# Patient Record
Sex: Male | Born: 1990 | Race: Black or African American | Hispanic: No | Marital: Single | Smoking: Current every day smoker
Health system: Southern US, Community
[De-identification: ages and names within clinical notes are randomized; demographics above are authoritative.]

## PROBLEM LIST (undated history)

## (undated) DIAGNOSIS — B009 Herpesviral infection, unspecified: Secondary | ICD-10-CM

## (undated) DIAGNOSIS — G43909 Migraine, unspecified, not intractable, without status migrainosus: Secondary | ICD-10-CM

## (undated) DIAGNOSIS — A749 Chlamydial infection, unspecified: Secondary | ICD-10-CM

---
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
## 2010-12-14 ENCOUNTER — Inpatient Hospital Stay (INDEPENDENT_AMBULATORY_CARE_PROVIDER_SITE_OTHER)
Admission: RE | Admit: 2010-12-14 | Discharge: 2010-12-14 | Disposition: A | Discharge status: Home / Self Care | Payer: Self-pay | Source: Ambulatory Visit | Attending: Emergency Medicine | Admitting: Emergency Medicine

## 2010-12-14 DIAGNOSIS — A64 Unspecified sexually transmitted disease: Secondary | ICD-10-CM

## 2010-12-19 LAB — GC/CHLAMYDIA PROBE AMP, GENITAL
Chlamydia, DNA Probe: POSITIVE — AB
GC Probe Amp, Genital: NEGATIVE

## 2011-10-21 ENCOUNTER — Emergency Department (HOSPITAL_COMMUNITY)
Admission: EM | Admit: 2011-10-21 | Discharge: 2011-10-21 | Disposition: A | Discharge status: Home / Self Care | Payer: BC Managed Care – PPO | Attending: Emergency Medicine | Admitting: Emergency Medicine

## 2011-10-21 ENCOUNTER — Encounter: Payer: Self-pay | Admitting: Emergency Medicine

## 2011-10-21 DIAGNOSIS — R599 Enlarged lymph nodes, unspecified: Secondary | ICD-10-CM | POA: Insufficient documentation

## 2011-10-21 DIAGNOSIS — R0602 Shortness of breath: Secondary | ICD-10-CM | POA: Insufficient documentation

## 2011-10-21 DIAGNOSIS — J029 Acute pharyngitis, unspecified: Secondary | ICD-10-CM | POA: Insufficient documentation

## 2011-10-21 LAB — RAPID STREP SCREEN (MED CTR MEBANE ONLY): Streptococcus, Group A Screen (Direct): NEGATIVE

## 2011-10-21 MED ORDER — DEXAMETHASONE SODIUM PHOSPHATE 10 MG/ML IJ SOLN
10.0000 mg | Freq: Once | INTRAMUSCULAR | Status: AC
  Administered 2011-10-21: 10 mg via INTRAVENOUS
  Filled 2011-10-21: qty 1

## 2011-10-21 MED ORDER — CLINDAMYCIN PHOSPHATE 600 MG/50ML IV SOLN
600.0000 mg | Freq: Once | INTRAVENOUS | Status: AC
  Administered 2011-10-21: 600 mg via INTRAVENOUS
  Filled 2011-10-21: qty 50

## 2011-10-21 MED ORDER — CLINDAMYCIN HCL 150 MG PO CAPS: 300.0000 mg | ORAL_CAPSULE | Freq: Four times a day (QID) | ORAL | Status: AC

## 2011-10-21 MED ORDER — ONDANSETRON 4 MG PO TBDP
4.0000 mg | ORAL_TABLET | Freq: Once | ORAL | Status: AC
  Administered 2011-10-21: 4 mg via ORAL
  Filled 2011-10-21: qty 1

## 2011-10-21 MED ORDER — HYDROCODONE-ACETAMINOPHEN 7.5-500 MG/15ML PO SOLN: 15.0000 mL | Freq: Four times a day (QID) | ORAL | Status: AC | PRN

## 2011-10-21 MED ORDER — SODIUM CHLORIDE 0.9 % IV BOLUS (SEPSIS)
1000.0000 mL | Freq: Once | INTRAVENOUS | Status: AC
  Administered 2011-10-21: 1000 mL via INTRAVENOUS

## 2011-10-21 MED ORDER — HYDROMORPHONE HCL PF 1 MG/ML IJ SOLN
0.5000 mg | Freq: Once | INTRAMUSCULAR | Status: AC
  Administered 2011-10-21: 0.5 mg via INTRAVENOUS
  Filled 2011-10-21: qty 1

## 2011-10-21 NOTE — ED Notes (Signed)
Pt c/o sore throat x 3 days. Pt noted to have sever swelling to back of throat, pt states it is hard to swallow and sometimes hard to breathe

## 2011-10-21 NOTE — ED Notes (Signed)
Pt states drank po without diffculty. Pt states throat feels better. Pt cont to await further dispo. Will cont to monitor

## 2011-10-21 NOTE — ED Notes (Signed)
Pt given coke 

## 2011-10-21 NOTE — ED Notes (Signed)
Pt given discharge instructions and verbalizes understanding  

## 2011-10-21 NOTE — ED Provider Notes (Signed)
History     CSN: [id number] Arrival date & time: 10/21/2011  6:03 PM   First MD Initiated Contact with Patient 10/21/11 1848      Chief Complaint  Patient presents with  . Sore Throat    (Consider location/radiation/quality/duration/timing/severity/associated sxs/prior treatment) Patient is a 20 y.o. male presenting with pharyngitis. The history is provided by the patient.  Sore Throat This is a new problem. The current episode started more than 2 days ago. The problem occurs constantly. The problem has been gradually worsening. Associated symptoms include shortness of breath. Pertinent negatives include no chest pain, no abdominal pain and no headaches.   patient has had a sore throat for the last 3 days. Some swelling. Some difficulty swallowing mild difficulty breathing due to the pain. Some chills. No cough. He is not drooling. He is otherwise healthy.  Past Medical History  Diagnosis Date  . Asthma     History reviewed. No pertinent past surgical history.  History reviewed. No pertinent family history.  History  Substance Use Topics  . Smoking status: Current Everyday Smoker    Types: Cigarettes  . Smokeless tobacco: Not on file  . Alcohol Use: Yes      Review of Systems  Constitutional: Positive for fever. Negative for activity change and appetite change.  HENT: Positive for sore throat. Negative for congestion, neck stiffness and tinnitus.   Eyes: Negative for pain.  Respiratory: Positive for shortness of breath. Negative for chest tightness, wheezing and stridor.   Cardiovascular: Negative for chest pain and leg swelling.  Gastrointestinal: Negative for nausea, vomiting, abdominal pain and diarrhea.  Genitourinary: Negative for flank pain.  Musculoskeletal: Negative for back pain.  Skin: Negative for rash.  Neurological: Negative for weakness, numbness and headaches.  Psychiatric/Behavioral: Negative for behavioral problems.    Allergies   Penicillins  Home Medications   Current Outpatient Rx  Name Route Sig Dispense Refill  . CLINDAMYCIN HCL 150 MG PO CAPS Oral Take 2 capsules (300 mg total) by mouth 4 (four) times daily. 56 capsule 0  . HYDROCODONE-ACETAMINOPHEN 7.5-500 MG/15ML PO SOLN Oral Take 15 mLs by mouth every 6 (six) hours as needed for pain. 120 mL 0    BP 107/57  Pulse 63  Temp(Src) 98 F (36.7 C) (Oral)  Resp 18  SpO2 100%  Physical Exam  Nursing note and vitals reviewed. Constitutional: He is oriented to person, place, and time. He appears well-developed and well-nourished.  HENT:  Head: Normocephalic and atraumatic.       Posterior pharyngeal erythema and edema. Worse on left side. Uvula is midline. Mild bilateral tonsillar swelling. No stridor.  Eyes: EOM are normal. Pupils are equal, round, and reactive to light.  Neck: Normal range of motion. Neck supple. No tracheal deviation present.       Cervical adenopathy worse on the left side.  Cardiovascular: Normal rate, regular rhythm and normal heart sounds.   No murmur heard. Pulmonary/Chest: Effort normal and breath sounds normal.  Abdominal: Soft. Bowel sounds are normal. He exhibits no distension and no mass. There is no tenderness. There is no rebound and no guarding.  Musculoskeletal: Normal range of motion. He exhibits no edema.  Lymphadenopathy:    He has cervical adenopathy.  Neurological: He is alert and oriented to person, place, and time. No cranial nerve deficit.  Skin: Skin is warm and dry.  Psychiatric: He has a normal mood and affect.    ED Course  Procedures (including critical care time)  Labs Reviewed  RAPID STREP SCREEN   No results found.   1. Pharyngitis       MDM  Patient has had sore throat for last 2 days. Pain is worse with swallowing. There is some lateralizing posteriorly. Does not appear to be a peritonsillar abscess at this point. Patient feels better after treatment. He was given steroids and  antibiotics here. I will see him in followup.        Juliet Rude. Rubin Payor, MD 10/21/11 [phone]

## 2012-02-05 ENCOUNTER — Encounter (HOSPITAL_COMMUNITY): Payer: Self-pay | Admitting: *Deleted

## 2012-02-05 ENCOUNTER — Emergency Department (INDEPENDENT_AMBULATORY_CARE_PROVIDER_SITE_OTHER)
Admission: EM | Admit: 2012-02-05 | Discharge: 2012-02-05 | Disposition: A | Discharge status: Home / Self Care | Payer: BC Managed Care – PPO | Source: Home / Self Care | Attending: Emergency Medicine | Admitting: Emergency Medicine

## 2012-02-05 DIAGNOSIS — N342 Other urethritis: Secondary | ICD-10-CM

## 2012-02-05 HISTORY — DX: Chlamydial infection, unspecified: A74.9

## 2012-02-05 MED ORDER — LIDOCAINE HCL (PF) 1 % IJ SOLN
INTRAMUSCULAR | Status: AC
  Filled 2012-02-05: qty 5

## 2012-02-05 MED ORDER — AZITHROMYCIN 250 MG PO TABS
ORAL_TABLET | ORAL | Status: AC
  Filled 2012-02-05: qty 4

## 2012-02-05 MED ORDER — CEFTRIAXONE SODIUM 250 MG IJ SOLR
250.0000 mg | Freq: Once | INTRAMUSCULAR | Status: AC
  Administered 2012-02-05: 250 mg via INTRAMUSCULAR

## 2012-02-05 MED ORDER — AZITHROMYCIN 250 MG PO TABS
1000.0000 mg | ORAL_TABLET | Freq: Once | ORAL | Status: AC
  Administered 2012-02-05: 1000 mg via ORAL

## 2012-02-05 MED ORDER — CEFTRIAXONE SODIUM 250 MG IJ SOLR
INTRAMUSCULAR | Status: AC
  Filled 2012-02-05: qty 250

## 2012-02-05 NOTE — ED Notes (Signed)
WHEN OBTAINED LAB CLICKED OFF, SHOULD HAVE BEEN PENILE INSTEAD OF CERVIX. CALLED MAIN LAB FOR VERBAL CORRECTION OF LOCATION

## 2012-02-05 NOTE — Discharge Instructions (Signed)
Take the medication as written. Give us a working phone number so that we can contact you if needed. Refrain from sexual contact until you know your results and your partner(s) are treated. Return if you get worse, have a fever >100.4, or for any concerns.  ° °Go to www.goodrx.com to look up your medications. This will give you a list of where you can find your prescriptions at the most affordable prices.  °

## 2012-02-05 NOTE — ED Provider Notes (Signed)
History     CSN: [id number]  Arrival date & time 02/05/12  4098   First MD Initiated Contact with Patient 02/05/12 [phone]      Chief Complaint  Patient presents with  . SEXUALLY TRANSMITTED DISEASE    (Consider location/radiation/quality/duration/timing/severity/associated sxs/prior treatment) HPI Comments: Pt with one week of clear nonodorous penile discharge.  No urgency, frequency, dysuria, oderous urine, hematuria,  genital blisters, penile itching. No fevers, N/V, abd pain.  Pt sexually active with 4-5 male partners, who are asxatic. States that he forgot to use condom with one of them. STD's a concern today. Has a history of Chlamydia. No history of gonorrhea  trichmonoas. No h/o syphilis, herpes, HIV.    Patient is a 21 y.o. male presenting with penile discharge. The history is provided by the patient. No language interpreter was used.  Penile Discharge This is a new problem. The current episode started more than 2 days ago. The problem occurs constantly. The problem has not changed since onset.Pertinent negatives include no abdominal pain. The symptoms are aggravated by nothing. The symptoms are relieved by nothing. He has tried nothing for the symptoms. The treatment provided no relief.    Past Medical History  Diagnosis Date  . Asthma   . Chlamydia     History reviewed. No pertinent past surgical history.  History reviewed. No pertinent family history.  History  Substance Use Topics  . Smoking status: Current Everyday Smoker -- 0.5 packs/day    Types: Cigarettes  . Smokeless tobacco: Not on file  . Alcohol Use: Yes      Review of Systems  Constitutional: Negative for fever.  Gastrointestinal: Negative for nausea, vomiting and abdominal pain.  Genitourinary: Positive for discharge. Negative for dysuria, urgency, frequency, hematuria, penile swelling, scrotal swelling, genital sores, penile pain and testicular pain.  Musculoskeletal: Negative for arthralgias.   Skin: Negative for rash.  Neurological: Negative for weakness.    Allergies  Penicillins  Home Medications  No current outpatient prescriptions on file.  BP 113/63  Pulse 77  Temp(Src) 98.7 F (37.1 C) (Oral)  Resp 17  SpO2 100%  Physical Exam  Nursing note and vitals reviewed. Constitutional: He is oriented to person, place, and time. He appears well-developed and well-nourished.  HENT:  Head: Normocephalic and atraumatic.  Eyes: Conjunctivae and EOM are normal.  Neck: Normal range of motion.  Cardiovascular: Normal rate.   Pulmonary/Chest: Effort normal. No respiratory distress.  Abdominal: He exhibits no distension.  Genitourinary: Testes normal. Uncircumcised. Discharge found.       No genital rash. Clear penile discharge. Patient declined chaperone  Musculoskeletal: Normal range of motion.  Lymphadenopathy:       Right: No inguinal adenopathy present.       Left: No inguinal adenopathy present.  Neurological: He is alert and oriented to person, place, and time.  Skin: Skin is warm and dry.  Psychiatric: He has a normal mood and affect. His behavior is normal.    ED Course  Procedures (including critical care time)   Labs Reviewed  GC/CHLAMYDIA PROBE AMP, GENITAL   No results found.   1. Urethritis       MDM  Previous records reviewed. Patient with positive chlamydia February 2012. Gonorrhea negative  H&P most c/w chlamydia.. Sent off GC/chlamydia,Will  treat empirically now. Giving ceftriaxone 250 mg IM/azithro 1 gm po.  Advised pt to refrain from sexual contact until  he knows lab results, symptoms resolve, and partner(s) are treated. Pt provided working phone  number. Pt agrees.     Luiz Blare, MD 02/05/12 [phone]

## 2012-02-05 NOTE — ED Notes (Signed)
Pt states he's been having a penile discharge with "stinging" sensation when he urinates for approx a wee

## 2012-02-06 LAB — GC/CHLAMYDIA PROBE AMP, GENITAL: GC Probe Amp, Genital: NEGATIVE

## 2012-02-07 ENCOUNTER — Telehealth (HOSPITAL_COMMUNITY): Payer: Self-pay | Admitting: *Deleted

## 2012-02-07 NOTE — ED Notes (Addendum)
GC neg., Chlamydia pos. Pt. adequately treated with Zithromax and Rocephin. I called pt. Pt. verified x 2 and given results.  Pt. told he was adequately treated with the Zithromax. Pt. instructed to notify his partner, no sex for 1 week and to practice safe sex. Pt. told they can get HIV testing at the [facility]. STD clinic.  Pt. voiced understanding.  DHHS form completed and faxed to the [facility]. Vassie Moselle 02/07/2012

## 2012-07-03 ENCOUNTER — Emergency Department (INDEPENDENT_AMBULATORY_CARE_PROVIDER_SITE_OTHER)
Admission: EM | Admit: 2012-07-03 | Discharge: 2012-07-03 | Disposition: A | Discharge status: Home / Self Care | Payer: BC Managed Care – PPO | Source: Home / Self Care | Attending: Family Medicine | Admitting: Family Medicine

## 2012-07-03 ENCOUNTER — Encounter (HOSPITAL_COMMUNITY): Payer: Self-pay

## 2012-07-03 DIAGNOSIS — N342 Other urethritis: Secondary | ICD-10-CM

## 2012-07-03 DIAGNOSIS — B360 Pityriasis versicolor: Secondary | ICD-10-CM

## 2012-07-03 MED ORDER — CEFTRIAXONE SODIUM 1 G IJ SOLR
250.0000 mg | Freq: Once | INTRAMUSCULAR | Status: AC
  Administered 2012-07-03: 250 mg via INTRAMUSCULAR

## 2012-07-03 MED ORDER — LIDOCAINE HCL (PF) 1 % IJ SOLN
INTRAMUSCULAR | Status: AC
  Filled 2012-07-03: qty 5

## 2012-07-03 MED ORDER — AZITHROMYCIN 250 MG PO TABS
ORAL_TABLET | ORAL | Status: AC
  Filled 2012-07-03: qty 4

## 2012-07-03 MED ORDER — AZITHROMYCIN 250 MG PO TABS
1000.0000 mg | ORAL_TABLET | Freq: Once | ORAL | Status: AC
  Administered 2012-07-03: 1000 mg via ORAL

## 2012-07-03 MED ORDER — TERBINAFINE HCL 250 MG PO TABS: 250.0000 mg | ORAL_TABLET | Freq: Every day | ORAL | Status: DC

## 2012-07-03 MED ORDER — CEFTRIAXONE SODIUM 250 MG IJ SOLR
INTRAMUSCULAR | Status: AC
  Filled 2012-07-03: qty 250

## 2012-07-03 NOTE — ED Provider Notes (Signed)
History     CSN: [id number]  Arrival date & time 07/03/12  1059   First MD Initiated Contact with Patient 07/03/12 1112      Chief Complaint  Patient presents with  . SEXUALLY TRANSMITTED DISEASE    (Consider location/radiation/quality/duration/timing/severity/associated sxs/prior treatment) Patient is a 21 y.o. male presenting with STD exposure and rash. The history is provided by the patient.  Exposure to STD This is a new problem. The current episode started more than 2 days ago. The problem has been gradually worsening.  Rash  This is a chronic problem. The current episode started more than 1 week ago. The problem has not changed since onset.The problem is associated with an unknown factor. There has been no fever. The rash is present on the torso.    Past Medical History  Diagnosis Date  . Asthma   . Chlamydia     History reviewed. No pertinent past surgical history.  History reviewed. No pertinent family history.  History  Substance Use Topics  . Smoking status: Current Everyday Smoker -- 0.5 packs/day    Types: Cigarettes  . Smokeless tobacco: Not on file  . Alcohol Use: Yes      Review of Systems  Constitutional: Negative.   Genitourinary: Positive for discharge. Negative for penile swelling and penile pain.  Skin: Positive for rash.    Allergies  Penicillins  Home Medications   Current Outpatient Rx  Name Route Sig Dispense Refill  . TERBINAFINE HCL 250 MG PO TABS Oral Take 1 tablet (250 mg total) by mouth daily. As instructed. 30 tablet 0    BP 134/83  Pulse 74  Temp 98.2 F (36.8 C) (Oral)  Resp 16  SpO2 100%  Physical Exam  Nursing note and vitals reviewed. Constitutional: He appears well-developed and well-nourished.  Genitourinary: Testes normal. Uncircumcised. Discharge found.  Lymphadenopathy:       Right: No inguinal adenopathy present.       Left: No inguinal adenopathy present.  Skin: Skin is warm and dry. Rash noted.   Pruritic scaly hypopigmented irreg rash across upper chest and back.    ED Course  Procedures (including critical care time)   Labs Reviewed  GC/CHLAMYDIA PROBE AMP, GENITAL   No results found.   1. Urethritis   2. Tinea versicolor       MDM          Linna Hoff, MD 07/03/12 [phone]

## 2012-07-03 NOTE — ED Notes (Signed)
C/o 3 month duration of rash on shoulder , hist of ringworm; Also concerned for poss STD , c/o dysuria and penile d/c

## 2012-07-07 LAB — GC/CHLAMYDIA PROBE AMP, GENITAL
Chlamydia, DNA Probe: NEGATIVE
GC Probe Amp, Genital: POSITIVE — AB

## 2012-07-14 ENCOUNTER — Telehealth (HOSPITAL_COMMUNITY): Payer: Self-pay | Admitting: *Deleted

## 2012-07-14 NOTE — ED Notes (Signed)
GC pos., Chlamydia neg.,  Pt. adequately treated with Rocephin and Zithromax.  I called pt.  Pt. verified x 2 and given results. Pt. told he was adequately treated.  Pt. instructed to notify his partner, no sex for 1 week and to practice safe sex. Pt. told they can get HIV testing at the [facility]. STD clinic, by appointment.  Pt. voiced understanding.  DHHS form completed and faxed to the [facility]. Vassie Moselle 07/14/2012

## 2013-01-23 ENCOUNTER — Emergency Department (HOSPITAL_COMMUNITY)
Admission: EM | Admit: 2013-01-23 | Discharge: 2013-01-23 | Disposition: A | Discharge status: Home / Self Care | Payer: Self-pay | Attending: Emergency Medicine | Admitting: Emergency Medicine

## 2013-01-23 ENCOUNTER — Encounter (HOSPITAL_COMMUNITY): Payer: Self-pay | Admitting: Emergency Medicine

## 2013-01-23 DIAGNOSIS — J029 Acute pharyngitis, unspecified: Secondary | ICD-10-CM | POA: Insufficient documentation

## 2013-01-23 DIAGNOSIS — Z8619 Personal history of other infectious and parasitic diseases: Secondary | ICD-10-CM | POA: Insufficient documentation

## 2013-01-23 DIAGNOSIS — F1210 Cannabis abuse, uncomplicated: Secondary | ICD-10-CM | POA: Insufficient documentation

## 2013-01-23 DIAGNOSIS — J45909 Unspecified asthma, uncomplicated: Secondary | ICD-10-CM | POA: Insufficient documentation

## 2013-01-23 DIAGNOSIS — R509 Fever, unspecified: Secondary | ICD-10-CM | POA: Insufficient documentation

## 2013-01-23 DIAGNOSIS — F17200 Nicotine dependence, unspecified, uncomplicated: Secondary | ICD-10-CM | POA: Insufficient documentation

## 2013-01-23 MED ORDER — LIDOCAINE HCL 2 % IJ SOLN
INTRAMUSCULAR | Status: AC
  Administered 2013-01-23: 0.9 mL
  Filled 2013-01-23: qty 20

## 2013-01-23 MED ORDER — CEFTRIAXONE SODIUM 250 MG IJ SOLR
250.0000 mg | Freq: Once | INTRAMUSCULAR | Status: AC
  Administered 2013-01-23: 250 mg via INTRAMUSCULAR
  Filled 2013-01-23: qty 250

## 2013-01-23 MED ORDER — AZITHROMYCIN 250 MG PO TABS
1000.0000 mg | ORAL_TABLET | Freq: Once | ORAL | Status: AC
  Administered 2013-01-23: 1000 mg via ORAL
  Filled 2013-01-23: qty 4

## 2013-01-23 NOTE — ED Provider Notes (Signed)
  Medical screening examination/treatment/procedure(s) were performed by non-physician practitioner and as supervising physician I was immediately available for consultation/collaboration.    Isrrael Fluckiger, MD 01/23/13 1548 

## 2013-01-23 NOTE — ED Notes (Signed)
Per pt, was treated for sore throat on Wednesday-given prednisone and Zpack-not helping-blisters on throat

## 2013-01-23 NOTE — ED Provider Notes (Signed)
History     CSN: [id number]  Arrival date & time 01/23/13  4098   First MD Initiated Contact with Patient 01/23/13 1003      Chief Complaint  Patient presents with  . Sore Throat    (Consider location/radiation/quality/duration/timing/severity/associated sxs/prior treatment) HPI Comments: This is a 22 year old male, who presents emergency department with chief complaint of sore throat. He was seen recently for the same in [facility], and was given prednisone and a Z-Pak. Patient states that this helped. He reports running a fever at night. States that his pain is moderate to severe. He denies any difficulty breathing. States the pain is worsened with swallowing. He endorses having lots of oral sex.  The history is provided by the patient. No language interpreter was used.    Past Medical History  Diagnosis Date  . Asthma   . Chlamydia     History reviewed. No pertinent past surgical history.  No family history on file.  History  Substance Use Topics  . Smoking status: Current Every Day Smoker -- 0.50 packs/day    Types: Cigarettes  . Smokeless tobacco: Not on file  . Alcohol Use: Yes      Review of Systems  All other systems reviewed and are negative.    Allergies  Penicillins  Home Medications   Current Outpatient Rx  Name  Route  Sig  Dispense  Refill  . terbinafine (LAMISIL) 250 MG tablet   Oral   Take 1 tablet (250 mg total) by mouth daily. As instructed.   30 tablet   0     BP 103/57  Pulse 85  Temp(Src) 98.9 F (37.2 C) (Oral)  Resp 18  SpO2 100%  Physical Exam  Nursing note and vitals reviewed. Constitutional: He is oriented to person, place, and time. He appears well-developed and well-nourished.  HENT:  Head: Normocephalic and atraumatic.  Oropharynx is red and inflamed, with exudates on tonsils, no signs of tonsillar or peritonsillar abscesses, uvula is midline, airway is intact  Eyes: Conjunctivae and EOM are normal.  Neck: Normal  range of motion.  Cardiovascular: Normal rate.   Pulmonary/Chest: Effort normal.  Abdominal: He exhibits no distension.  Musculoskeletal: Normal range of motion.  Neurological: He is alert and oriented to person, place, and time.  Skin: Skin is dry.  Psychiatric: He has a normal mood and affect. His behavior is normal. Judgment and thought content normal.    ED Course  Procedures (including critical care time)  Labs Reviewed  GC/CHLAMYDIA PROBE AMP   Results for orders placed during the hospital encounter of 01/23/13  RAPID STREP SCREEN      Result Value Range   Streptococcus, Group A Screen (Direct) NEGATIVE  NEGATIVE         1. Pharyngitis       MDM  22 year old male with sore throat and tonsillar exudates. Patient recently tested for strep, which was negative. Will retest today. Also suspicious of, gonococcal infection, as the patient endorses having a lot of oral sex. Will wait rapid strep results, which if negative, will treat for GC/Chlamydia.  11:10 AM Rapid strep test is negative. Patient tells me he had a Monospot test, which was also negative on Wednesday. Will treat for gonococcal pharyngitis.        Roxy Horseman, PA-C 01/23/13 1112

## 2014-09-19 ENCOUNTER — Ambulatory Visit: Payer: Self-pay

## 2014-09-19 ENCOUNTER — Other Ambulatory Visit: Payer: Self-pay | Admitting: Occupational Medicine

## 2014-09-19 DIAGNOSIS — Z0000 Encounter for general adult medical examination without abnormal findings: Secondary | ICD-10-CM

## 2015-12-13 ENCOUNTER — Other Ambulatory Visit (HOSPITAL_COMMUNITY)
Admission: RE | Admit: 2015-12-13 | Discharge: 2015-12-13 | Disposition: A | Discharge status: Home / Self Care | Payer: PRIVATE HEALTH INSURANCE | Source: Ambulatory Visit | Attending: Family Medicine | Admitting: Family Medicine

## 2015-12-13 ENCOUNTER — Emergency Department (INDEPENDENT_AMBULATORY_CARE_PROVIDER_SITE_OTHER)
Admission: EM | Admit: 2015-12-13 | Discharge: 2015-12-13 | Disposition: A | Discharge status: Home / Self Care | Payer: PRIVATE HEALTH INSURANCE | Source: Home / Self Care | Attending: Family Medicine | Admitting: Family Medicine

## 2015-12-13 ENCOUNTER — Encounter (HOSPITAL_COMMUNITY): Payer: Self-pay | Admitting: *Deleted

## 2015-12-13 DIAGNOSIS — Z113 Encounter for screening for infections with a predominantly sexual mode of transmission: Secondary | ICD-10-CM | POA: Diagnosis present

## 2015-12-13 DIAGNOSIS — Z202 Contact with and (suspected) exposure to infections with a predominantly sexual mode of transmission: Secondary | ICD-10-CM

## 2015-12-13 MED ORDER — CEFTRIAXONE SODIUM 250 MG IJ SOLR
250.0000 mg | Freq: Once | INTRAMUSCULAR | Status: AC
  Administered 2015-12-13: 250 mg via INTRAMUSCULAR

## 2015-12-13 MED ORDER — AZITHROMYCIN 250 MG PO TABS
ORAL_TABLET | ORAL | Status: AC
  Filled 2015-12-13: qty 4

## 2015-12-13 MED ORDER — CEFTRIAXONE SODIUM 250 MG IJ SOLR
INTRAMUSCULAR | Status: AC
  Filled 2015-12-13: qty 250

## 2015-12-13 MED ORDER — AZITHROMYCIN 250 MG PO TABS
1000.0000 mg | ORAL_TABLET | Freq: Once | ORAL | Status: AC
  Administered 2015-12-13: 1000 mg via ORAL

## 2015-12-13 NOTE — ED Provider Notes (Signed)
CSN: [id number]     Arrival date & time 12/13/15  1629 History   First MD Initiated Contact with Patient 12/13/15 1817     Chief Complaint  Patient presents with  . Penile Discharge   (Consider location/radiation/quality/duration/timing/severity/associated sxs/prior Treatment) Patient is a 25 y.o. male presenting with penile discharge. The history is provided by the patient.  Penile Discharge This is a new problem. The current episode started more than 2 days ago (condom broke , now with d/c.). The problem has been gradually worsening. Pertinent negatives include no abdominal pain. Associated symptoms comments: Dysuria, purulent d/c.Marland Kitchen    Past Medical History  Diagnosis Date  . Asthma   . Chlamydia    History reviewed. No pertinent past surgical history. History reviewed. No pertinent family history. Social History  Substance Use Topics  . Smoking status: Current Every Day Smoker -- 0.50 packs/day    Types: Cigarettes  . Smokeless tobacco: None  . Alcohol Use: Yes    Review of Systems  Constitutional: Negative.   Cardiovascular: Negative.   Gastrointestinal: Negative for abdominal pain.  Genitourinary: Positive for discharge and penile swelling. Negative for scrotal swelling, penile pain and testicular pain.    Allergies  Penicillins  Home Medications   Prior to Admission medications   Medication Sig Start Date End Date Taking? Authorizing Provider  azithromycin (ZITHROMAX Z-PAK) 250 MG tablet Take 250-500 mg by mouth daily. 2 tablets on day 1 then 1 tablet daily until finished    Historical Provider, MD   Meds Ordered and Administered this Visit   Medications  cefTRIAXone (ROCEPHIN) injection 250 mg (250 mg Intramuscular Given 12/13/15 1839)  azithromycin (ZITHROMAX) tablet 1,000 mg (1,000 mg Oral Given 12/13/15 1839)    BP 121/75 mmHg  Pulse 79  Temp(Src) 98 F (36.7 C) (Oral)  Resp 18  SpO2 100% No data found.   Physical Exam  Constitutional: He is oriented  to person, place, and time. He appears well-developed and well-nourished.  Abdominal: Soft. Bowel sounds are normal.  Genitourinary: Testes normal. Right testis shows no swelling and no tenderness. Left testis shows no swelling and no tenderness. Uncircumcised. No penile tenderness. Discharge found.  Lymphadenopathy:       Right: No inguinal adenopathy present.       Left: No inguinal adenopathy present.  Neurological: He is alert and oriented to person, place, and time.  Skin: Skin is warm and dry.  Nursing note and vitals reviewed.   ED Course  Procedures (including critical care time)  Labs Review Labs Reviewed  CYTOLOGY, (ORAL, ANAL, URETHRAL) ANCILLARY ONLY    Imaging Review No results found.   Visual Acuity Review  Right Eye Distance:   Left Eye Distance:   Bilateral Distance:    Right Eye Near:   Left Eye Near:    Bilateral Near:         MDM   1. Potential exposure to STD        Linna Hoff, MD 12/13/15 2105

## 2015-12-13 NOTE — ED Notes (Signed)
Penile  Discharge   X  1  Week  Pt  Reports   Condom  Busted  During  Recent   Sex

## 2015-12-13 NOTE — Discharge Instructions (Signed)
We will call with positive test results and treat as indicated  °

## 2015-12-14 LAB — CYTOLOGY, (ORAL, ANAL, URETHRAL) ANCILLARY ONLY
Chlamydia: NEGATIVE
NEISSERIA GONORRHEA: NEGATIVE

## 2016-05-21 ENCOUNTER — Encounter (HOSPITAL_COMMUNITY): Payer: Self-pay | Admitting: Emergency Medicine

## 2016-05-21 ENCOUNTER — Ambulatory Visit (HOSPITAL_COMMUNITY)
Admission: EM | Admit: 2016-05-21 | Discharge: 2016-05-21 | Disposition: A | Discharge status: Home / Self Care | Payer: PRIVATE HEALTH INSURANCE | Attending: Family Medicine | Admitting: Family Medicine

## 2016-05-21 DIAGNOSIS — M545 Low back pain, unspecified: Secondary | ICD-10-CM

## 2016-05-21 DIAGNOSIS — F17210 Nicotine dependence, cigarettes, uncomplicated: Secondary | ICD-10-CM | POA: Insufficient documentation

## 2016-05-21 DIAGNOSIS — N419 Inflammatory disease of prostate, unspecified: Secondary | ICD-10-CM | POA: Diagnosis not present

## 2016-05-21 DIAGNOSIS — J45909 Unspecified asthma, uncomplicated: Secondary | ICD-10-CM | POA: Insufficient documentation

## 2016-05-21 DIAGNOSIS — M549 Dorsalgia, unspecified: Secondary | ICD-10-CM | POA: Insufficient documentation

## 2016-05-21 DIAGNOSIS — R300 Dysuria: Secondary | ICD-10-CM | POA: Insufficient documentation

## 2016-05-21 DIAGNOSIS — Z202 Contact with and (suspected) exposure to infections with a predominantly sexual mode of transmission: Secondary | ICD-10-CM | POA: Diagnosis not present

## 2016-05-21 DIAGNOSIS — Z880 Allergy status to penicillin: Secondary | ICD-10-CM | POA: Insufficient documentation

## 2016-05-21 LAB — POCT URINALYSIS DIP (DEVICE)
BILIRUBIN URINE: NEGATIVE
GLUCOSE, UA: NEGATIVE mg/dL
Hgb urine dipstick: NEGATIVE
KETONES UR: NEGATIVE mg/dL
LEUKOCYTES UA: NEGATIVE
NITRITE: NEGATIVE
PH: 7.5 (ref 5.0–8.0)
Protein, ur: NEGATIVE mg/dL
Specific Gravity, Urine: 1.01 (ref 1.005–1.030)
Urobilinogen, UA: 0.2 mg/dL (ref 0.0–1.0)

## 2016-05-21 MED ORDER — AZITHROMYCIN 250 MG PO TABS
1000.0000 mg | ORAL_TABLET | Freq: Once | ORAL | Status: AC
  Administered 2016-05-21: 1000 mg via ORAL

## 2016-05-21 MED ORDER — CEFTRIAXONE SODIUM 250 MG IJ SOLR
250.0000 mg | Freq: Once | INTRAMUSCULAR | Status: AC
  Administered 2016-05-21: 250 mg via INTRAMUSCULAR

## 2016-05-21 MED ORDER — CIPROFLOXACIN HCL 500 MG PO TABS: 500.0000 mg | ORAL_TABLET | Freq: Two times a day (BID) | ORAL | Status: DC

## 2016-05-21 MED ORDER — LIDOCAINE HCL 2 % IJ SOLN
INTRAMUSCULAR | Status: AC
  Filled 2016-05-21: qty 20

## 2016-05-21 MED ORDER — CEFTRIAXONE SODIUM 250 MG IJ SOLR
INTRAMUSCULAR | Status: AC
  Filled 2016-05-21: qty 250

## 2016-05-21 MED ORDER — NAPROXEN 375 MG PO TABS: 375.0000 mg | ORAL_TABLET | Freq: Two times a day (BID) | ORAL | Status: DC

## 2016-05-21 MED ORDER — AZITHROMYCIN 250 MG PO TABS
ORAL_TABLET | ORAL | Status: AC
  Filled 2016-05-21: qty 4

## 2016-05-21 NOTE — ED Notes (Signed)
[facility] a [facility] of bilateral lower back pain and urinary frequency that started 2 days ago. The patient denied any known strains or injuries to his back.

## 2016-05-21 NOTE — ED Provider Notes (Signed)
CSN: [id number]     Arrival date & time 05/21/16  1014 History   First MD Initiated Contact with Patient 05/21/16 1204     Chief Complaint  Patient presents with  . Back Pain  . Urinary Frequency   (Consider location/radiation/quality/duration/timing/severity/associated sxs/prior Treatment) HPI Comments: 25 year old male presents with c/o difficulty urinating.   He states he has problems getting urine out.  He denies any painful urination but states his urine stream is abnormal and he dribbles.  He denies any unprotected sex.  Patient is a 25 y.o. male presenting with back pain and frequency.  Back Pain Location:  Generalized Quality:  Aching Radiates to:  Does not radiate Pain severity:  Mild Pain is:  Worse during the day Onset quality:  Sudden Duration:  1 day Timing:  Constant Progression:  Worsening Chronicity:  New Relieved by:  Nothing Worsened by:  Nothing tried Ineffective treatments:  None tried Associated symptoms: dysuria   Urinary Frequency    Past Medical History  Diagnosis Date  . Asthma   . Chlamydia    History reviewed. No pertinent past surgical history. History reviewed. No pertinent family history. Social History  Substance Use Topics  . Smoking status: Current Every Day Smoker -- 0.50 packs/day    Types: Cigarettes  . Smokeless tobacco: None  . Alcohol Use: Yes    Review of Systems  Constitutional: Negative.   HENT: Negative.   Eyes: Negative.   Respiratory: Negative.   Cardiovascular: Negative.   Gastrointestinal: Negative.   Endocrine: Negative.   Genitourinary: Positive for dysuria and frequency.  Musculoskeletal: Positive for back pain.  Skin: Negative.   Allergic/Immunologic: Negative.   Neurological: Negative.   Hematological: Negative.   Psychiatric/Behavioral: Negative.     Allergies  Penicillins  Home Medications   Prior to Admission medications   Medication Sig Start Date End Date Taking? Authorizing Provider   azithromycin (ZITHROMAX Z-PAK) 250 MG tablet Take 250-500 mg by mouth daily. 2 tablets on day 1 then 1 tablet daily until finished    Historical Provider, MD   Meds Ordered and Administered this Visit  Medications - No data to display  BP 115/78 mmHg  Pulse 65  Temp(Src) 97.7 F (36.5 C) (Oral)  Resp 18  SpO2 98% No data found.   Physical Exam  Constitutional: He appears well-developed and well-nourished.  HENT:  Head: Normocephalic and atraumatic.  Right Ear: External ear normal.  Left Ear: External ear normal.  Mouth/Throat: Oropharynx is clear and moist.  Eyes: Conjunctivae are normal. Pupils are equal, round, and reactive to light.  Neck: Normal range of motion. Neck supple.  Cardiovascular: Normal rate, regular rhythm and normal heart sounds.   Pulmonary/Chest: Effort normal and breath sounds normal.  Abdominal: Soft. Bowel sounds are normal.  Genitourinary: Rectum normal, prostate normal and penis normal.  Musculoskeletal: He exhibits tenderness.  TTP bilateral lumbar paraspinous muscles.    ED Course  Procedures (including critical care time)  Labs Review Labs Reviewed  POCT URINALYSIS DIP (DEVICE)    Imaging Review No results found.   Visual Acuity Review  Right Eye Distance:   Left Eye Distance:   Bilateral Distance:    Right Eye Near:   Left Eye Near:    Bilateral Near:         MDM  Dysuria - cipro 500mg  one po bid x 10 days #20.  UA is wnl.  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  Follow up with PCP if no improvement.  Back pain - naprosyn  one po bid x 10 days #20  Possible STD or STD exposure - Patient mentions that he did get a blow job and is worried he may have and std.  He denies any dysuria or urethral dc and exam is normal.  Will emirically tx with  Rocephin  IM and zithromax  x 4 today here in clinic.  Advised patient that he will be called with  results of UA gc/chlamydia and trichomonas.    Deatra Canter, FNP 05/21/16 1247

## 2016-05-21 NOTE — Discharge Instructions (Signed)

## 2016-05-22 LAB — URINE CYTOLOGY ANCILLARY ONLY
Chlamydia: NEGATIVE
Neisseria Gonorrhea: NEGATIVE
Trichomonas: NEGATIVE

## 2016-07-31 ENCOUNTER — Ambulatory Visit: Payer: Self-pay

## 2016-07-31 ENCOUNTER — Other Ambulatory Visit: Payer: Self-pay | Admitting: Occupational Medicine

## 2016-07-31 DIAGNOSIS — Z021 Encounter for pre-employment examination: Secondary | ICD-10-CM

## 2016-09-06 ENCOUNTER — Encounter (HOSPITAL_COMMUNITY): Payer: Self-pay | Admitting: *Deleted

## 2016-09-06 ENCOUNTER — Ambulatory Visit (HOSPITAL_COMMUNITY)
Admission: EM | Admit: 2016-09-06 | Discharge: 2016-09-06 | Disposition: A | Discharge status: Home / Self Care | Payer: PRIVATE HEALTH INSURANCE | Attending: Family Medicine | Admitting: Family Medicine

## 2016-09-06 DIAGNOSIS — N342 Other urethritis: Secondary | ICD-10-CM | POA: Insufficient documentation

## 2016-09-06 DIAGNOSIS — F17210 Nicotine dependence, cigarettes, uncomplicated: Secondary | ICD-10-CM | POA: Insufficient documentation

## 2016-09-06 LAB — POCT URINALYSIS DIP (DEVICE)
BILIRUBIN URINE: NEGATIVE
Glucose, UA: NEGATIVE mg/dL
KETONES UR: NEGATIVE mg/dL
Leukocytes, UA: NEGATIVE
Nitrite: NEGATIVE
PH: 7 (ref 5.0–8.0)
Protein, ur: NEGATIVE mg/dL
SPECIFIC GRAVITY, URINE: 1.02 (ref 1.005–1.030)
Urobilinogen, UA: 1 mg/dL (ref 0.0–1.0)

## 2016-09-06 MED ORDER — DOXYCYCLINE HYCLATE 100 MG PO TABS: 100.0000 mg | ORAL_TABLET | Freq: Two times a day (BID) | ORAL | 0 refills | Status: DC

## 2016-09-06 NOTE — ED Triage Notes (Signed)
Pt    Reports  Penile   Discharge  That went away 2  Days  Ago      No   Sores     Pt   describes  A  Little  Sting  When  He  Urinates   He  Noticed   The symptoms    3  Days  Ago

## 2016-09-06 NOTE — ED Notes (Signed)
Call back number verified and updated in EPIC... Adv pt to not have SI until lab results comeback neg.... Also adv pt lab results will be on MyChart; instructions given .... Pt verb understanding.   

## 2016-09-06 NOTE — ED Provider Notes (Signed)
[facility]    CSN: [id number] Arrival date & time: 09/06/16  1427     History   Chief Complaint Chief Complaint  Patient presents with  . Penile Discharge    HPI Tyrone Payne is a 25 y.o. male.   This is a 25 year old gentleman who presents to the urgent care center seeking evaluation of a penile discharge.  Patient is quite upset. He's been here 3 times before and been checked for STDs and nothing has turned positive. He has a new partner now. He uses condoms, but he notes that he is uncircumcised, and the condom often breaks when he uses it.  Patient works in a [employer], [employer] environment.       Past Medical History:  Diagnosis Date  . Asthma   . Chlamydia     There are no active problems to display for this patient.   History reviewed. No pertinent surgical history.     Home Medications    Prior to Admission medications   Medication Sig Start Date End Date Taking? Authorizing Provider  doxycycline (VIBRA-TABS) 100 MG tablet Take 1 tablet (100 mg total) by mouth 2 (two) times daily. 09/06/16   Elvina SidleKurt Llewellyn Schoenberger, MD  naproxen (NAPROSYN) 375 MG tablet Take 1 tablet (375 mg total) by mouth 2 (two) times daily. 05/21/16   Deatra CanterWilliam J Oxford, FNP    Family History History reviewed. No pertinent family history.  Social History Social History  Substance Use Topics  . Smoking status: Current Every Day Smoker    Packs/day: 0.50    Types: Cigarettes  . Smokeless tobacco: Not on file  . Alcohol use Yes     Allergies   Penicillins   Review of Systems Review of Systems  Constitutional: Negative.   HENT: Negative for sore throat.   Genitourinary: Positive for discharge and dysuria.  Musculoskeletal: Negative for arthralgias.     Physical Exam Triage Vital Signs ED Triage Vitals [09/06/16 1456]  Enc Vitals Group     BP 120/70     Pulse Rate 68     Resp 18     Temp 98.2 F (36.8 C)     Temp Source Oral     SpO2 100 %   Weight      Height      Head Circumference      Peak Flow      Pain Score      Pain Loc      Pain Edu?      Excl. in GC?    No data found.   Updated Vital Signs BP 120/70 (BP Location: Right Arm)   Pulse 68   Temp 98.2 F (36.8 C) (Oral)   Resp 18   SpO2 100%  urethrit Physical Exam  Constitutional: He appears well-developed and well-nourished.  HENT:  Head: Normocephalic.  Right Ear: External ear normal.  Left Ear: External ear normal.  Mouth/Throat: Oropharynx is clear and moist.  Eyes: Conjunctivae and EOM are normal. Pupils are equal, round, and reactive to light.  Neck: Normal range of motion. Neck supple.  Pulmonary/Chest: Effort normal.  Genitourinary: Penis normal.  Genitourinary Comments: Uncircumcised   Nursing note and vitals reviewed.    UC Treatments / Results  Labs (all labs ordered are listed, but only abnormal results are displayed) Labs Reviewed  POCT URINALYSIS DIP (DEVICE) - Abnormal; Notable for the following:       Result Value   Hgb urine dipstick TRACE (*)    All  other components within normal limits  URINE CYTOLOGY ANCILLARY ONLY    EKG  EKG Interpretation None       Radiology No results found.  Procedures Procedures (including critical care time)  Medications Ordered in UC Medications - No data to display   Initial Impression / Assessment and Plan / UC Course  I have reviewed the triage vital signs and the nursing notes.  Pertinent labs & imaging results that were available during my care of the patient were reviewed by me and considered in my medical decision making (see chart for details).  Clinical Course    Final Clinical Impressions(s) / UC Diagnoses   Final diagnoses:  Urethritis    New Prescriptions New Prescriptions   DOXYCYCLINE (VIBRA-TABS) 100 MG TABLET    Take 1 tablet (100 mg total) by mouth 2 (two) times daily.     Elvina SidleKurt Meda Dudzinski, MD 09/06/16 1536

## 2016-09-09 LAB — URINE CYTOLOGY ANCILLARY ONLY
Chlamydia: NEGATIVE
Neisseria Gonorrhea: NEGATIVE

## 2016-10-18 ENCOUNTER — Ambulatory Visit (HOSPITAL_COMMUNITY)
Admission: EM | Admit: 2016-10-18 | Discharge: 2016-10-18 | Disposition: A | Discharge status: Home / Self Care | Payer: PRIVATE HEALTH INSURANCE | Attending: Internal Medicine | Admitting: Internal Medicine

## 2016-10-18 ENCOUNTER — Encounter (HOSPITAL_COMMUNITY): Payer: Self-pay | Admitting: Emergency Medicine

## 2016-10-18 DIAGNOSIS — Z79899 Other long term (current) drug therapy: Secondary | ICD-10-CM | POA: Diagnosis not present

## 2016-10-18 DIAGNOSIS — B360 Pityriasis versicolor: Secondary | ICD-10-CM | POA: Diagnosis not present

## 2016-10-18 DIAGNOSIS — F17210 Nicotine dependence, cigarettes, uncomplicated: Secondary | ICD-10-CM | POA: Insufficient documentation

## 2016-10-18 DIAGNOSIS — J029 Acute pharyngitis, unspecified: Secondary | ICD-10-CM | POA: Diagnosis not present

## 2016-10-18 DIAGNOSIS — Z880 Allergy status to penicillin: Secondary | ICD-10-CM | POA: Diagnosis not present

## 2016-10-18 DIAGNOSIS — J028 Acute pharyngitis due to other specified organisms: Secondary | ICD-10-CM | POA: Diagnosis not present

## 2016-10-18 DIAGNOSIS — Z202 Contact with and (suspected) exposure to infections with a predominantly sexual mode of transmission: Secondary | ICD-10-CM | POA: Insufficient documentation

## 2016-10-18 DIAGNOSIS — N481 Balanitis: Secondary | ICD-10-CM

## 2016-10-18 LAB — RAPID HIV SCREEN (HIV 1/2 AB+AG)
HIV 1/2 Antibodies: NONREACTIVE
HIV-1 P24 Antigen - HIV24: NONREACTIVE

## 2016-10-18 LAB — POCT RAPID STREP A: Streptococcus, Group A Screen (Direct): NEGATIVE

## 2016-10-18 MED ORDER — NYSTATIN 100000 UNIT/GM EX CREA: TOPICAL_CREAM | CUTANEOUS | 0 refills | Status: DC

## 2016-10-18 MED ORDER — SELENIUM SULFIDE 2.5 % EX LOTN: 1.0000 "application " | TOPICAL_LOTION | Freq: Every day | CUTANEOUS | 12 refills | Status: DC | PRN

## 2016-10-18 MED ORDER — SELENIUM SULFIDE 2.5 % EX LOTN: 1.0000 "application " | TOPICAL_LOTION | Freq: Every day | CUTANEOUS | 2 refills | Status: DC | PRN

## 2016-10-18 MED ORDER — IPRATROPIUM BROMIDE 0.06 % NA SOLN: 2.0000 | Freq: Four times a day (QID) | NASAL | 1 refills | Status: DC

## 2016-10-18 NOTE — ED Triage Notes (Signed)
Here for fungal rash all over body onset 5 years... Has been to dermatologist  Using selson blue w/temp relief  Also c/o rash and bumps to penis onset 1 month... Denies pain, penile d/c... Seen here on 11/3 for similar sx  Sexually active w/occasional condom use  A&O x4... NAD

## 2016-10-18 NOTE — ED Provider Notes (Signed)
CSN: [id number]     Arrival date & time 10/18/16  1727 History   None    Chief Complaint  Patient presents with  . Sore Throat  . Exposure to STD   (Consider location/radiation/quality/duration/timing/severity/associated sxs/prior Treatment) Patient c/o sore throat.  Patient c/o bumps on his penis.  Patient does not wear protection when sexually active.  He states he is in monogamous relationship. He has rash on his back.     The history is provided by the patient.  Sore Throat  This is a new problem. The current episode started more than 1 week ago. The problem occurs constantly. The problem has not changed since onset.Nothing aggravates the symptoms. Nothing relieves the symptoms. He has tried nothing for the symptoms.  Exposure to STD     Past Medical History:  Diagnosis Date  . Asthma   . Chlamydia    History reviewed. No pertinent surgical history. History reviewed. No pertinent family history. Social History  Substance Use Topics  . Smoking status: Current Every Day Smoker    Packs/day: 0.50    Types: Cigarettes  . Smokeless tobacco: Never Used  . Alcohol use Yes    Review of Systems  Constitutional: Negative.   HENT: Negative.   Eyes: Negative.   Respiratory: Negative.   Cardiovascular: Negative.   Gastrointestinal: Negative.   Endocrine: Negative.   Genitourinary: Negative.   Musculoskeletal: Negative.   Skin: Positive for rash.  Allergic/Immunologic: Negative.   Neurological: Negative.   Hematological: Negative.   Psychiatric/Behavioral: Negative.     Allergies  Penicillins  Home Medications   Prior to Admission medications   Medication Sig Start Date End Date Taking? Authorizing Provider  doxycycline (VIBRA-TABS) 100 MG tablet Take 1 tablet (100 mg total) by mouth 2 (two) times daily. 09/06/16   Elvina SidleKurt Lauenstein, MD  ipratropium (ATROVENT) 0.06 % nasal spray Place 2 sprays into both nostrils 4 (four) times daily. 10/18/16   Deatra CanterWilliam J Jone Panebianco, FNP   naproxen (NAPROSYN) 375 MG tablet Take 1 tablet (375 mg total) by mouth 2 (two) times daily. 05/21/16   Deatra CanterWilliam J Annalyse Langlais, FNP  nystatin cream (MYCOSTATIN) Apply to penis bumps area 2 times daily 10/18/16   Deatra CanterWilliam J Dillion Stowers, FNP  selenium sulfide (SELSUN) 2.5 % shampoo Apply 1 application topically daily as needed for irritation. 10/18/16   Deatra CanterWilliam J Muna Demers, FNP   Meds Ordered and Administered this Visit  Medications - No data to display  BP 131/80 (BP Location: Left Arm)   Pulse 69   Temp 98.2 F (36.8 C) (Oral)   Resp 20   SpO2 100%  No data found.   Physical Exam  Constitutional: He is oriented to person, place, and time. He appears well-developed and well-nourished.  HENT:  Head: Normocephalic.  Right Ear: External ear normal.  Left Ear: External ear normal.  Nose: Nose normal.  OPX - Erythematous  Eyes: Conjunctivae and EOM are normal. Pupils are equal, round, and reactive to light.  Neck: Normal range of motion. Neck supple.  Cardiovascular: Normal rate, regular rhythm and normal heart sounds.   Pulmonary/Chest: Effort normal and breath sounds normal.  Genitourinary: Penis normal.  Neurological: He is alert and oriented to person, place, and time.  Skin:  Tinea Versicolor bilateral shoulders  Nursing note and vitals reviewed.   Urgent Care Course   Clinical Course     Procedures (including critical care time)  Labs Review Labs Reviewed  RAPID HIV SCREEN (HIV 1/2 AB+AG)  HSV 1 ANTIBODY,  IGG  HSV 2 ANTIBODY, IGG  POCT RAPID STREP A  URINE CYTOLOGY ANCILLARY ONLY    Imaging Review No results found.   Visual Acuity Review  Right Eye Distance:   Left Eye Distance:   Bilateral Distance:    Right Eye Near:   Left Eye Near:    Bilateral Near:         MDM   1. Pharyngitis due to other organism   2. Tinea versicolor   3. Balanitis   Selenium Sulfide Cream apply bid #30grams Atrovent Nasal spray 2 sprays per nostril qid prn #4315ml Nystatin Cream  apply bid to penis #30grams  Urine cytology GC Chlamydia, trichomonas,  HIV, HSV 1 HSV2,  Reassurance given and advised to use condom when sexually active.     Deatra CanterWilliam J Salah Burlison, FNP 10/18/16 [phone]

## 2016-10-19 LAB — HSV 1 ANTIBODY, IGG: HSV 1 Glycoprotein G Ab, IgG: <0.91 index (ref 0.00–0.90)

## 2016-10-19 LAB — HSV 2 ANTIBODY, IGG: HSV 2 Glycoprotein G Ab, IgG: 5.25 index — ABNORMAL HIGH (ref 0.00–0.90)

## 2016-10-20 LAB — CULTURE, GROUP A STREP (THRC)

## 2016-10-21 LAB — URINE CYTOLOGY ANCILLARY ONLY
Chlamydia: NEGATIVE
Neisseria Gonorrhea: NEGATIVE
Trichomonas: NEGATIVE

## 2016-10-22 ENCOUNTER — Telehealth (HOSPITAL_COMMUNITY): Payer: Self-pay | Admitting: Internal Medicine

## 2016-10-22 MED ORDER — AZITHROMYCIN 250 MG PO TABS: 250.0000 mg | ORAL_TABLET | Freq: Every day | ORAL | 0 refills | Status: DC

## 2016-10-22 NOTE — Telephone Encounter (Signed)
Clinical staff, please let patient know that throat cx was positive for strep.  Will send rx for zithromax to pharmacy of record, [facility] [address].  Recheck for further evaluation if sore throat persists.   Also, blood test for herpes virus was positive.  Test does not tell whether this is new or old/previous infection.   Recheck for further evaluation as needed.  LM

## 2016-10-24 ENCOUNTER — Encounter (HOSPITAL_COMMUNITY): Payer: Self-pay | Admitting: Family Medicine

## 2016-10-24 ENCOUNTER — Ambulatory Visit (HOSPITAL_COMMUNITY)
Admission: EM | Admit: 2016-10-24 | Discharge: 2016-10-24 | Disposition: A | Discharge status: Home / Self Care | Payer: PRIVATE HEALTH INSURANCE | Attending: Emergency Medicine | Admitting: Emergency Medicine

## 2016-10-24 DIAGNOSIS — R21 Rash and other nonspecific skin eruption: Secondary | ICD-10-CM

## 2016-10-24 NOTE — Discharge Instructions (Signed)
The little bumps you have are coming from dry skin and irritation. This is not a herpes outbreak. All the blood test tells us is that at some point you were exposed to the virus. If you have never had an outbreak, you do not need to worry about this. I strongly recommend condom use with every sexual encounter. If you develop painful red blisters on either your mouth or your penis, please come back. Otherwise, you don't need to worry about this.

## 2016-10-24 NOTE — ED Provider Notes (Addendum)
[facility]    CSN: [id number] Arrival date & time: 10/24/16  1759     History   Chief Complaint Chief Complaint  Patient presents with  . Exposure to STD    HPI Tyrone Payne is a 25 y.o. male.   HPI He is a 25 year old man here for follow-up. He was seen here a few days ago and tested for STDs. At that time, he requested testing for HSV as he had bumps on his penis. 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 All other testing was negative. When his results were called to him, he became quite alarmed about this. He is here for follow-up. He continues to have some small bumps around the glans of his penis. They are nonpainful. They are not blisters. He denies ever having any sort of vesicular painful eruption either on the genitals or around his mouth.  Past Medical History:  Diagnosis Date  . Asthma   . Chlamydia     There are no active problems to display for this patient.   History reviewed. No pertinent surgical history.     Home Medications    Prior to Admission medications   Medication Sig Start Date End Date Taking? Authorizing Provider  azithromycin (ZITHROMAX) 250 MG tablet Take 1 tablet (250 mg total) by mouth daily. Take first 2 tablets together, then 1 every day until finished. 10/22/16   Eustace MooreLaura W Murray, MD  doxycycline (VIBRA-TABS) 100 MG tablet Take 1 tablet (100 mg total) by mouth 2 (two) times daily. 09/06/16   Elvina SidleKurt Lauenstein, MD  ipratropium (ATROVENT) 0.06 % nasal spray Place 2 sprays into both nostrils 4 (four) times daily. 10/18/16   Deatra CanterWilliam J Oxford, FNP  naproxen (NAPROSYN) 375 MG tablet Take 1 tablet (375 mg total) by mouth 2 (two) times daily. 05/21/16   Deatra CanterWilliam J Oxford, FNP  nystatin cream (MYCOSTATIN) Apply to penis bumps area 2 times daily 10/18/16   Deatra CanterWilliam J Oxford, FNP  selenium sulfide (SELSUN) 2.5 % shampoo Apply 1 application topically daily as needed for irritation. 10/18/16   Deatra CanterWilliam J Oxford, FNP    Family  History History reviewed. No pertinent family history.  Social History Social History  Substance Use Topics  . Smoking status: Current Every Day Smoker    Packs/day: 0.50    Types: Cigarettes  . Smokeless tobacco: Never Used  . Alcohol use Yes     Allergies   Penicillins   Review of Systems Review of Systems As in history of present illness  Physical Exam Triage Vital Signs ED Triage Vitals [10/24/16 1809]  Enc Vitals Group     BP 124/82     Pulse Rate 85     Resp 18     Temp 98.1 F (36.7 C)     Temp src      SpO2 100 %     Weight      Height      Head Circumference      Peak Flow      Pain Score      Pain Loc      Pain Edu?      Excl. in GC?    No data found.   Updated Vital Signs BP 124/82 (BP Location: Right Arm)   Pulse 85   Temp 98.1 F (36.7 C)   Resp 18   SpO2 100%   Visual Acuity Right Eye Distance:   Left Eye Distance:   Bilateral Distance:    Right  Eye Near:   Left Eye Near:    Bilateral Near:     Physical Exam  Constitutional: He is oriented to person, place, and time. He appears well-developed and well-nourished. No distress.  Anxious  Cardiovascular: Normal rate.   Pulmonary/Chest: Effort normal.  Genitourinary: Uncircumcised.     Neurological: He is oriented to person, place, and time.     UC Treatments / Results  Labs (all labs ordered are listed, but only abnormal results are displayed) Labs Reviewed - No data to display  EKG  EKG Interpretation None       Radiology No results found.  Procedures Procedures (including critical care time)  Medications Ordered in UC Medications - No data to display   Initial Impression / Assessment and Plan / UC Course  I have reviewed the triage vital signs and the nursing notes.  Pertinent labs & imaging results that were available during my care of the patient were reviewed by me and considered in my medical decision making (see chart for details).  Clinical Course      Physical exam is not consistent with herpes outbreak. Provided extensive education about the herpes virus. Provided reassurance that there is nothing he needs to do differently at this time. Strongly recommended using a condom every single time. If he develops painful blisters either around his mouth or on the penis he should return for evaluation. Exam today is consistent with some mild dry skin and irritation. Recommended Vaseline.  Final Clinical Impressions(s) / UC Diagnoses   Final diagnoses:  Penile rash    New Prescriptions Discharge Medication List as of 10/24/2016  6:29 PM       Charm RingsErin J Srija Southard, MD 10/24/16 1844    Charm RingsErin J Nidia Grogan, MD 10/24/16 1844

## 2016-10-24 NOTE — ED Triage Notes (Signed)
Pt here for STD re check. St she was called and told positive.

## 2016-12-06 ENCOUNTER — Emergency Department (HOSPITAL_COMMUNITY)
Admission: EM | Admit: 2016-12-06 | Discharge: 2016-12-06 | Disposition: A | Discharge status: Home / Self Care | Payer: BLUE CROSS/BLUE SHIELD | Attending: Emergency Medicine | Admitting: Emergency Medicine

## 2016-12-06 ENCOUNTER — Encounter (HOSPITAL_COMMUNITY): Payer: Self-pay

## 2016-12-06 ENCOUNTER — Emergency Department (HOSPITAL_COMMUNITY): Payer: BLUE CROSS/BLUE SHIELD

## 2016-12-06 DIAGNOSIS — J069 Acute upper respiratory infection, unspecified: Secondary | ICD-10-CM | POA: Diagnosis present

## 2016-12-06 DIAGNOSIS — J45909 Unspecified asthma, uncomplicated: Secondary | ICD-10-CM | POA: Diagnosis not present

## 2016-12-06 DIAGNOSIS — Z79899 Other long term (current) drug therapy: Secondary | ICD-10-CM | POA: Insufficient documentation

## 2016-12-06 DIAGNOSIS — F17210 Nicotine dependence, cigarettes, uncomplicated: Secondary | ICD-10-CM | POA: Diagnosis not present

## 2016-12-06 DIAGNOSIS — J020 Streptococcal pharyngitis: Secondary | ICD-10-CM | POA: Insufficient documentation

## 2016-12-06 LAB — RAPID STREP SCREEN (MED CTR MEBANE ONLY): Streptococcus, Group A Screen (Direct): POSITIVE — AB

## 2016-12-06 MED ORDER — CLINDAMYCIN HCL 300 MG PO CAPS: 300.0000 mg | ORAL_CAPSULE | Freq: Three times a day (TID) | ORAL | 0 refills | Status: DC

## 2016-12-06 NOTE — ED Provider Notes (Signed)
[facility]   CSN: [id number] Arrival date & time: 12/06/16  0140 By signing my name below, I, Tyrone Payne, attest that this documentation has been prepared under the direction and in the presence of Tyrone Octave, MD . Electronically Signed: Levon Payne, Scribe. 12/06/2016. 1:58 AM.   History   Chief Complaint Chief Complaint  Patient presents with  . URI    HPI Tyrone Payne is a 26 y.o. male who presents to the Emergency Department complaining of intermittent cough productive of brown sputum onset three days ago. He notes associated nasal congestion, clear rhinorrhea, shortness of breath and sore throat. Pt states he works at a [employer] and has increased symptoms while at work. No alleviating factors noted. No medications taken PTA. Pt is a smoker. He denies fever, vomiting, diarrhea, headache, generalized body aches, chest pain, sinus pain, abdominal pain, or any other associated symptoms.   The history is provided by the patient. No language interpreter was used.    Past Medical History:  Diagnosis Date  . Asthma   . Chlamydia     There are no active problems to display for this patient.   History reviewed. No pertinent surgical history.     Home Medications    Prior to Admission medications   Medication Sig Start Date End Date Taking? Authorizing Provider  azithromycin (ZITHROMAX) 250 MG tablet Take 1 tablet (250 mg total) by mouth daily. Take first 2 tablets together, then 1 every day until finished. 10/22/16   Eustace Moore, MD  doxycycline (VIBRA-TABS) 100 MG tablet Take 1 tablet (100 mg total) by mouth 2 (two) times daily. 09/06/16   Elvina Sidle, MD  ipratropium (ATROVENT) 0.06 % nasal spray Place 2 sprays into both nostrils 4 (four) times daily. 10/18/16   Deatra Canter, FNP  naproxen (NAPROSYN) 375 MG tablet Take 1 tablet (375 mg total) by mouth 2 (two) times daily. 05/21/16   Deatra Canter, FNP  nystatin cream  (MYCOSTATIN) Apply to penis bumps area 2 times daily 10/18/16   Deatra Canter, FNP  selenium sulfide (SELSUN) 2.5 % shampoo Apply 1 application topically daily as needed for irritation. 10/18/16   Deatra Canter, FNP    Family History History reviewed. No pertinent family history.  Social History Social History  Substance Use Topics  . Smoking status: Current Every Day Smoker    Packs/day: 0.50    Types: Cigarettes  . Smokeless tobacco: Never Used  . Alcohol use Yes     Comment: occ     Allergies   Penicillins   Review of Systems Review of Systems 10 systems reviewed and all are negative for acute change except as noted in the HPI.  Physical Exam Updated Vital Signs BP 112/77 (BP Location: Right Arm)   Pulse 67   Temp 97.8 F (36.6 C) (Oral)   Resp 18   Ht 5\' 6"  (1.676 m)   Wt 125 lb (56.7 kg)   SpO2 100%   BMI 20.18 kg/m   Physical Exam  Constitutional: He is oriented to person, place, and time. He appears well-developed and well-nourished. No distress.  HENT:  Head: Normocephalic and atraumatic.  Mouth/Throat: Oropharynx is clear and moist. No oropharyngeal exudate.  Postnasal drip. Mild erythema of oropharynx.   Eyes: Conjunctivae and EOM are normal. Pupils are equal, round, and reactive to light.  Neck: Normal range of motion. Neck supple.  No meningismus.  Cardiovascular: Normal rate, regular rhythm, normal heart sounds and  intact distal pulses.   No murmur heard. Pulmonary/Chest: Effort normal and breath sounds normal. No respiratory distress.  Abdominal: Soft. There is no tenderness. There is no rebound and no guarding.  Musculoskeletal: Normal range of motion. He exhibits no edema or tenderness.  Neurological: He is alert and oriented to person, place, and time. No cranial nerve deficit. He exhibits normal muscle tone. Coordination normal.   5/5 strength throughout. CN 2-12 intact.Equal grip strength.   Skin: Skin is warm.  Psychiatric: He has a  normal mood and affect. His behavior is normal.  Nursing note and vitals reviewed.   ED Treatments / Results  DIAGNOSTIC STUDIES:  Oxygen Saturation is 100% on RA, normal by my interpretation.    COORDINATION OF CARE:  1:58 AM Discussed treatment plan with pt at bedside and pt agreed to plan.  Labs (all labs ordered are listed, but only abnormal results are displayed) Labs Reviewed  RAPID STREP SCREEN (NOT AT [facility]) - Abnormal; Notable for the following:       Result Value   Streptococcus, Group A Screen (Direct) POSITIVE (*)    All other components within normal limits    EKG  EKG Interpretation None       Radiology Dg Chest 2 View  Result Date: 12/06/2016 CLINICAL DATA:  Productive cough, nasal congestion for 3 days. History of asthma. EXAM: CHEST  2 VIEW COMPARISON:  Chest radiograph July 31, 2016 FINDINGS: Cardiomediastinal silhouette is normal. No pleural effusions or focal consolidations. Trachea projects midline and there is no pneumothorax. Soft tissue planes and included osseous structures are non-suspicious. IMPRESSION: Normal chest. Electronically Signed   By: Awilda Metroourtnay  Bloomer M.D.   On: 12/06/2016 02:27    Procedures Procedures (including critical care time)  Medications Ordered in ED Medications - No data to display   Initial Impression / Assessment and Plan / ED Course  I have reviewed the triage vital signs and the nursing notes.  Pertinent labs & imaging results that were available during my care of the patient were reviewed by me and considered in my medical decision making (see chart for details).    3 days of cough, nasal congestion, sore throat, brown sputum. No fever. Sick contacts at work.  No distress. Rapid strep is positive. Patient is penicillin allergic. We'll treat with clindamycin. Chest x-rays negative.  Patient well-appearing. Discussed supportive care at home follow-up with PCP.  Final Clinical Impressions(s) / ED Diagnoses    Final diagnoses:  Strep pharyngitis    New Prescriptions New Prescriptions   No medications on file  I personally performed the services described in this documentation, which was scribed in my presence. The recorded information has been reviewed and is accurate.    Tyrone OctaveStephen Makar Slatter, MD 12/06/16 [phone]

## 2016-12-06 NOTE — ED Triage Notes (Signed)
Pt endorses cold sx(productive cough with Sady Monaco sputum, nasal congestion) x 3 days. Denies fevers/chills. Afebrile at this time. VSS.

## 2016-12-06 NOTE — Discharge Instructions (Signed)
Keep yourself hydrated. Use tylenol or motrin as needed for fever. Return to the ED if you develop new or worsening symptoms. °

## 2016-12-06 NOTE — ED Notes (Signed)
Patient transported to X-ray 

## 2017-01-25 ENCOUNTER — Ambulatory Visit (HOSPITAL_COMMUNITY)
Admission: EM | Admit: 2017-01-25 | Discharge: 2017-01-25 | Disposition: A | Discharge status: Home / Self Care | Payer: BLUE CROSS/BLUE SHIELD | Attending: Internal Medicine | Admitting: Internal Medicine

## 2017-01-25 ENCOUNTER — Encounter (HOSPITAL_COMMUNITY): Payer: Self-pay | Admitting: Emergency Medicine

## 2017-01-25 DIAGNOSIS — F17210 Nicotine dependence, cigarettes, uncomplicated: Secondary | ICD-10-CM | POA: Insufficient documentation

## 2017-01-25 DIAGNOSIS — R369 Urethral discharge, unspecified: Secondary | ICD-10-CM | POA: Insufficient documentation

## 2017-01-25 DIAGNOSIS — R202 Paresthesia of skin: Secondary | ICD-10-CM | POA: Diagnosis not present

## 2017-01-25 DIAGNOSIS — Z202 Contact with and (suspected) exposure to infections with a predominantly sexual mode of transmission: Secondary | ICD-10-CM | POA: Insufficient documentation

## 2017-01-25 DIAGNOSIS — Z880 Allergy status to penicillin: Secondary | ICD-10-CM | POA: Diagnosis not present

## 2017-01-25 DIAGNOSIS — Z79899 Other long term (current) drug therapy: Secondary | ICD-10-CM | POA: Diagnosis not present

## 2017-01-25 DIAGNOSIS — N342 Other urethritis: Secondary | ICD-10-CM | POA: Diagnosis not present

## 2017-01-25 LAB — POCT URINALYSIS DIP (DEVICE)
Bilirubin Urine: NEGATIVE
GLUCOSE, UA: NEGATIVE mg/dL
Hgb urine dipstick: NEGATIVE
Ketones, ur: NEGATIVE mg/dL
Leukocytes, UA: NEGATIVE
Nitrite: NEGATIVE
PROTEIN: NEGATIVE mg/dL
SPECIFIC GRAVITY, URINE: 1.015 (ref 1.005–1.030)
UROBILINOGEN UA: 0.2 mg/dL (ref 0.0–1.0)
pH: 7 (ref 5.0–8.0)

## 2017-01-25 MED ORDER — DOXYCYCLINE HYCLATE 100 MG PO CAPS: 100.0000 mg | ORAL_CAPSULE | Freq: Two times a day (BID) | ORAL | 0 refills | Status: DC

## 2017-01-25 NOTE — ED Notes (Signed)
Call back number verified and updated in EPIC... Adv pt to not have SI until lab results comeback neg.... Also adv pt lab results will be on MyChart; instructions given .... Pt verb understanding.   

## 2017-01-25 NOTE — ED Triage Notes (Addendum)
Penile discharge, weird feeling with urination.  Patient recently received lab results from [facility] that have him very concerned and many questions

## 2017-01-25 NOTE — ED Notes (Signed)
Patient sent to bathroom to obtain a "dirty " and clean urine samples

## 2017-01-25 NOTE — ED Provider Notes (Signed)
CSN: [id number]     Arrival date & time 01/25/17  1548 History   First MD Initiated Contact with Patient 01/25/17 1624     Chief Complaint  Patient presents with  . Exposure to STD   (Consider location/radiation/quality/duration/timing/severity/associated sxs/prior Treatment) 26 year old male complaining of scant primarily colorless penile discharge for about 9-10 days. He states that initially it was than and then later become thicker like mucus. Occasionally he will have a tingling sensation with urination may be every 3-4 days. He states that when he palpates the underside of the penis along the urethra occasionally there is tenderness. Denies any lesions. He has been reading about STDs and in particular HSV and is had no symptoms other than what is stated above. He has been to the urgent care many times over the past 3 years or so and nearly all of his cytologies have been negative. Urinalysis have been negative.      Past Medical History:  Diagnosis Date  . Asthma   . Chlamydia    History reviewed. No pertinent surgical history. No family history on file. Social History  Substance Use Topics  . Smoking status: Current Every Day Smoker    Packs/day: 0.50    Types: Cigarettes  . Smokeless tobacco: Never Used  . Alcohol use Yes     Comment: occ    Review of Systems  Constitutional: Negative.   Gastrointestinal: Negative.   Genitourinary:       As per history of present illness  Neurological: Negative.   All other systems reviewed and are negative.   Allergies  Penicillins  Home Medications   Prior to Admission medications   Medication Sig Start Date End Date Taking? Authorizing Provider  doxycycline (VIBRAMYCIN) 100 MG capsule Take 1 capsule (100 mg total) by mouth 2 (two) times daily. 01/25/17   Hayden Rasmussen, NP  ipratropium (ATROVENT) 0.06 % nasal spray Place 2 sprays into both nostrils 4 (four) times daily. 10/18/16   Deatra Canter, FNP  naproxen (NAPROSYN) 375  MG tablet Take 1 tablet (375 mg total) by mouth 2 (two) times daily. 05/21/16   Deatra Canter, FNP  nystatin cream (MYCOSTATIN) Apply to penis bumps area 2 times daily 10/18/16   Deatra Canter, FNP  selenium sulfide (SELSUN) 2.5 % shampoo Apply 1 application topically daily as needed for irritation. 10/18/16   Deatra Canter, FNP   Meds Ordered and Administered this Visit  Medications - No data to display  BP 132/84 (BP Location: Left Arm)   Pulse 67   Resp 18   SpO2 97%  No data found.   Physical Exam  Constitutional: He is oriented to person, place, and time. He appears well-developed and well-nourished. No distress.  Pulmonary/Chest: Effort normal.  Genitourinary:  Genitourinary Comments: Uncircumcised. DRE reveals firm nontender prostate. No other lesions palpated.  Musculoskeletal: Normal range of motion.  Neurological: He is alert and oriented to person, place, and time.  Skin: Skin is warm and dry.  Nursing note and vitals reviewed.   Urgent Care Course     Procedures (including critical care time)  Labs Review Labs Reviewed  POCT URINALYSIS DIP (DEVICE)  URINE CYTOLOGY ANCILLARY ONLY    Imaging Review No results found.   Visual Acuity Review  Right Eye Distance:   Left Eye Distance:   Bilateral Distance:    Right Eye Near:   Left Eye Near:    Bilateral Near:         MDM  1. Penile discharge   2. Urethritis   The discharge is understandably confusing. With negative test for GC, chlamydia and Trichomonas for the past several visits. The urinalysis was normal today as it has been in the past. Your being treated with doxycycline today. It is recommended that you follow-up with the urologist. Meds ordered this encounter  Medications  . doxycycline (VIBRAMYCIN) 100 MG capsule    Sig: Take 1 capsule (100 mg total) by mouth 2 (two) times daily.    Dispense:  20 capsule    Refill:  0    Order Specific Question:   Supervising Provider     Answer:   Eustace MooreMURRAY, LAURA W [[id number]]         Hayden Rasmussenavid Kerri-Anne Haeberle, NP 01/25/17 1714

## 2017-01-25 NOTE — Discharge Instructions (Signed)
The discharge is understandably confusing. With negative test for GC, chlamydia and Trichomonas for the past several visits. The urinalysis was normal today as it has been in the past. Your being treated with doxycycline today. It is recommended that you follow-up with the urologist.

## 2017-01-27 LAB — URINE CYTOLOGY ANCILLARY ONLY
CHLAMYDIA, DNA PROBE: NEGATIVE
Neisseria Gonorrhea: NEGATIVE
Trichomonas: NEGATIVE

## 2017-07-24 ENCOUNTER — Ambulatory Visit (HOSPITAL_COMMUNITY)
Admission: EM | Admit: 2017-07-24 | Discharge: 2017-07-24 | Disposition: A | Discharge status: Home / Self Care | Payer: BLUE CROSS/BLUE SHIELD | Attending: Family Medicine | Admitting: Family Medicine

## 2017-07-24 ENCOUNTER — Encounter (HOSPITAL_COMMUNITY): Payer: Self-pay | Admitting: Emergency Medicine

## 2017-07-24 DIAGNOSIS — J029 Acute pharyngitis, unspecified: Secondary | ICD-10-CM | POA: Diagnosis not present

## 2017-07-24 HISTORY — DX: Herpesviral infection, unspecified: B00.9

## 2017-07-24 MED ORDER — VALACYCLOVIR HCL 1 G PO TABS: 1000.0000 mg | ORAL_TABLET | Freq: Every day | ORAL | 11 refills | Status: DC

## 2017-07-24 MED ORDER — CLINDAMYCIN HCL 150 MG PO CAPS: 150.0000 mg | ORAL_CAPSULE | Freq: Three times a day (TID) | ORAL | 0 refills | Status: DC

## 2017-07-24 NOTE — ED Triage Notes (Signed)
Patient has irritation to left side of throat and swollen, tender lymph nodes.  Denies a runny nose, but it is congested

## 2017-07-24 NOTE — ED Provider Notes (Signed)
[facility]    CSN: [id number] Arrival date & time: 07/24/17  1200     History   Chief Complaint Chief Complaint  Patient presents with  . Sore Throat    HPI Tyrone Payne is a 26 y.o. male.   Patient has irritation to left side of throat and swollen, tender lymph nodes.  Denies a runny nose, but it is congested.  He's had the problem for 2 weeks.  Patient was diagnosed with HSV several months ago. He's worried that the throat has some in to do with that. He would also like to be put on a suppressant medication for HSV.  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 Asthma   . Chlamydia   . Herpes     There are no active problems to display for this patient.   History reviewed. No pertinent surgical history.     Home Medications    Prior to Admission medications   Medication Sig Start Date End Date Taking? Authorizing Provider  clindamycin (CLEOCIN) 150 MG capsule Take 1 capsule (150 mg total) by mouth 3 (three) times daily. 07/24/17   Elvina Sidle, MD  selenium sulfide (SELSUN) 2.5 % shampoo Apply 1 application topically daily as needed for irritation. 10/18/16   Deatra Canter, FNP  valACYclovir (VALTREX) 1000 MG tablet Take 1 tablet (1,000 mg total) by mouth daily. 07/24/17   Elvina Sidle, MD    Family History No family history on file.  Social History Social History  Substance Use Topics  . Smoking status: Current Every Day Smoker    Packs/day: 0.50    Types: Cigarettes  . Smokeless tobacco: Never Used  . Alcohol use Yes     Comment: occ     Allergies   Penicillins   Review of Systems Review of Systems  HENT: Positive for sore throat.   All other systems reviewed and are negative.    Physical Exam Triage Vital Signs ED Triage Vitals [07/24/17 1252]  Enc Vitals Group     BP 111/71     Pulse Rate 100     Resp 18     Temp 98.4 F (36.9 C)     Temp Source Oral     SpO2 (!) 64 %      Weight      Height      Head Circumference      Peak Flow      Pain Score      Pain Loc      Pain Edu?      Excl. in GC?    No data found.   Updated Vital Signs BP 111/71 (BP Location: Left Arm)   Pulse 100   Temp 98.4 F (36.9 C) (Oral)   Resp 18   SpO2 (!) 64%   Visual Acuity Right Eye Distance:   Left Eye Distance:   Bilateral Distance:    Right Eye Near:   Left Eye Near:    Bilateral Near:     Physical Exam  Constitutional: He is oriented to person, place, and time. He appears well-developed and well-nourished.  HENT:  Right Ear: External ear normal.  Left Ear: External ear normal.  Erythematous posterior pharynx  Eyes: Pupils are equal, round, and reactive to light. Conjunctivae are normal.  Neck: Normal range of motion. Neck supple.  Musculoskeletal: Normal range of motion.  Lymphadenopathy:    He has cervical adenopathy.  Neurological:  He is alert and oriented to person, place, and time.  Skin: Skin is warm and dry.  Nursing note and vitals reviewed.    UC Treatments / Results  Labs (all labs ordered are listed, but only abnormal results are displayed) Labs Reviewed - No data to display  EKG  EKG Interpretation None       Radiology No results found.  Procedures Procedures (including critical care time)  Medications Ordered in UC Medications - No data to display   Initial Impression / Assessment and Plan / UC Course  I have reviewed the triage vital signs and the nursing notes.  Pertinent labs & imaging results that were available during my care of the patient were reviewed by me and considered in my medical decision making (see chart for details).      Final Clinical Impressions(s) / UC Diagnoses   Final diagnoses:  Acute pharyngitis, unspecified etiology    New Prescriptions New Prescriptions   CLINDAMYCIN (CLEOCIN) 150 MG CAPSULE    Take 1 capsule (150 mg total) by mouth 3 (three) times daily.   VALACYCLOVIR (VALTREX)  1000 MG TABLET    Take 1 tablet (1,000 mg total) by mouth daily.     Controlled Substance Prescriptions [place] Controlled Substance Registry consulted? Not Applicable   Elvina Sidle, MD 07/24/17 1311

## 2017-09-17 ENCOUNTER — Emergency Department (HOSPITAL_COMMUNITY)
Admission: EM | Admit: 2017-09-17 | Discharge: 2017-09-17 | Discharge status: Against Medical Advice | Payer: BLUE CROSS/BLUE SHIELD | Attending: Emergency Medicine | Admitting: Emergency Medicine

## 2017-09-17 ENCOUNTER — Encounter (HOSPITAL_COMMUNITY): Payer: Self-pay | Admitting: Emergency Medicine

## 2017-09-17 ENCOUNTER — Other Ambulatory Visit: Payer: Self-pay

## 2017-09-17 DIAGNOSIS — R51 Headache: Secondary | ICD-10-CM | POA: Diagnosis not present

## 2017-09-17 DIAGNOSIS — Z5320 Procedure and treatment not carried out because of patient's decision for unspecified reasons: Secondary | ICD-10-CM | POA: Diagnosis not present

## 2017-09-17 DIAGNOSIS — R519 Headache, unspecified: Secondary | ICD-10-CM

## 2017-09-17 DIAGNOSIS — F17210 Nicotine dependence, cigarettes, uncomplicated: Secondary | ICD-10-CM | POA: Insufficient documentation

## 2017-09-17 DIAGNOSIS — J45909 Unspecified asthma, uncomplicated: Secondary | ICD-10-CM | POA: Diagnosis not present

## 2017-09-17 HISTORY — DX: Migraine, unspecified, not intractable, without status migrainosus: G43.909

## 2017-09-17 MED ORDER — AMOXICILLIN 500 MG PO CAPS: 500.0000 mg | ORAL_CAPSULE | Freq: Three times a day (TID) | ORAL | 0 refills | Status: DC

## 2017-09-17 MED ORDER — SODIUM CHLORIDE 0.9 % IV BOLUS (SEPSIS): 1000.0000 mL | Freq: Once | INTRAVENOUS | Status: DC

## 2017-09-17 NOTE — Discharge Instructions (Signed)
I given you a referral to neurology.

## 2017-09-17 NOTE — ED Notes (Signed)
ED Provider at bedside. 

## 2017-09-17 NOTE — ED Triage Notes (Signed)
Pt c/o migraine headache x's 3 days.  Denies nausea or vomiting

## 2017-09-17 NOTE — ED Provider Notes (Signed)
[facility]   CSN: [id number] Arrival date & time: 09/17/17  0212     History   Chief Complaint Chief Complaint  Patient presents with  . Headache    HPI Tyrone Payne is a 26 y.o. male.  HPI 26 year old African-American male past medical history significant for headaches presents to the emergency department today with complaints of a frontal headache.  Patient states that at this time his headache has resolved.  Patient states that he has had migraines since he was a child.  However he is never been diagnosed by a neurologist with migraines.  Patient states that over the past few days they have become more progressive.  Patient complains of a frontal headache that he describes as sharp and throbbing.  Patient states that when he falls asleep this helps relieve his pain.  States he just started working third shift.  Patient denies any associated vision changes.  States that working third shift he has decreased p.o. intake.  Patient denies any associated lightheadedness, dizziness, neck stiffness, vision changes, chest pain, shortness of breath, abdominal pain, nausea, emesis.  Does report photophobia with the headaches.  Denies any specific complaints at this time.  Patient has not training for symptoms prior to arrival.  Sleep makes them better.  Nothing makes it worse.  Pt denies any fever, chill, vision changes, lightheadedness, dizziness, congestion, neck pain, cp, sob, cough, abd pain, n/v/d, urinary symptoms, change in bowel habits, melena, hematochezia, lower extremity paresthesias.  Past Medical History:  Diagnosis Date  . Asthma   . Chlamydia   . Herpes   . Migraines     There are no active problems to display for this patient.   History reviewed. No pertinent surgical history.     Home Medications    Prior to Admission medications   Medication Sig Start Date End Date Taking? Authorizing Provider  vitamin C  (ASCORBIC ACID) 500 MG tablet Take 500-1,000 mg daily as needed by mouth (when feeling sick).   Yes [provider]    Family History No family history on file.  Social History Social History   Tobacco Use  . Smoking status: Current Every Day Smoker    Packs/day: 0.50    Types: Cigarettes  . Smokeless tobacco: Never Used  Substance Use Topics  . Alcohol use: Yes    Comment: occ  . Drug use: No     Allergies   Penicillins   Review of Systems Review of Systems  Constitutional: Negative for chills and fever.  HENT: Negative for congestion and sore throat.   Eyes: Positive for photophobia. Negative for visual disturbance.  Respiratory: Negative for cough and shortness of breath.   Cardiovascular: Negative for chest pain.  Gastrointestinal: Negative for abdominal pain, diarrhea, nausea and vomiting.  Genitourinary: Negative for dysuria, flank pain, frequency, hematuria, scrotal swelling, testicular pain and urgency.  Musculoskeletal: Negative for arthralgias and myalgias.  Skin: Negative for rash.  Neurological: Positive for headaches. Negative for dizziness, syncope, weakness, light-headedness and numbness.  Psychiatric/Behavioral: Negative for sleep disturbance. The patient is not nervous/anxious.      Physical Exam Updated Vital Signs BP 108/79   Pulse 65   Temp 98.7 F (37.1 C) (Oral)   Resp 18   Ht 5\' 7"  (1.702 m)   Wt 56.7 kg (125 lb)   SpO2 97%   BMI 19.58 kg/m   Physical Exam  Constitutional: He is oriented to person, place, and time. He appears  well-developed and well-nourished. No distress.  HENT:  Head: Normocephalic and atraumatic.  Eyes: Conjunctivae and EOM are normal. Pupils are equal, round, and reactive to light. Right eye exhibits no discharge. Left eye exhibits no discharge. No scleral icterus.  Neck: Normal range of motion. Neck supple.  No c spine midline tenderness. No paraspinal tenderness. No deformities or step offs noted. Full  ROM. Supple. No nuchal rigidity.    Cardiovascular: Normal rate and regular rhythm.  Pulmonary/Chest: Effort normal. No respiratory distress.  Musculoskeletal: Normal range of motion.  Neurological: He is alert and oriented to person, place, and time.  The patient is alert, attentive, and oriented x 3. Speech is clear. Cranial nerve II-VII grossly intact. Negative pronator drift. Sensation intact. Strength 5/5 in all extremities. Reflexes 2+ and symmetric at biceps, triceps, knees, and ankles. Rapid alternating movement and fine finger movements intact. Romberg is absent. Posture and gait normal.   Skin: Skin is warm and dry. Capillary refill takes less than 2 seconds. No pallor.  Psychiatric: His behavior is normal. Judgment and thought content normal.  Nursing note and vitals reviewed.    ED Treatments / Results  Labs (all labs ordered are listed, but only abnormal results are displayed) Labs Reviewed  BASIC METABOLIC PANEL  CBC WITH DIFFERENTIAL/PLATELET    EKG  EKG Interpretation None       Radiology No results found.  Procedures Procedures (including critical care time)  Medications Ordered in ED Medications  sodium chloride 0.9 % bolus 1,000 mL (not administered)     Initial Impression / Assessment and Plan / ED Course  I have reviewed the triage vital signs and the nursing notes.  Pertinent labs & imaging results that were available during my care of the patient were reviewed by me and considered in my medical decision making (see chart for details).     Patient presents to the ED for evaluation of headache which has since resolved.  Patient states he fell asleep in the lobby and the headache resolved.  Denies any pain at this time.  Patient states his headaches have been more constant over the past few days.  He states as a kid he has had headaches and was always told that he has migraines but has never followed up with a neurologist.  Patient denies any other  neurological symptoms.  He has no focal neuro deficits on exam.  Neurovascularly intact in all extremities.  Patient has no meningeal signs of be concerning for meningitis.  Patient states he has been some what more fatigued over the past week since starting third shift at work.  Given the increased frequency of the headaches with his fatigue felt that rule out lab work and CT imaging of head was indicated.  Discussed this with patient and he agreed.  We will start IV and give fluids will not give migraine cocktail this time as patient denies any headache.  Otherwise exam is unremarkable and reassuring.  Patient states that he has to go but cannot wait for any further workup at this time.  Discussed with patient that he came here for headaches and evaluation and I would like to do a thorough workup.  The patient states he needs to leave.  Will make patient sign out AMA.  Given the normal neurological exam and normal vital signs reassuring physical exam have low suspicion for any acute emergent pathology at this time.  Will give patient neurology follow-up.  They have decided to leave AMA. I have  discussed my concerns as a provider and the possibility that this may worsen. We discussed the nature, risks and benefits, and alternatives to treatment. I have specifically discussed that without further evaluation I cannot guarantee there is not a life threatening event occuring.  Time was given to allow the opportunity to ask questions and consider the options, and after the discussion, the patient decided to refuse the offered treatment. Pt is A&Ox4, his own POA and states understanding of my concerns and the possible consequences. After refusal, I made every reasonable opportunity to treat them to the best of my ability. I have made the patient aware that this is an AMA discharge, but they may return at any time for further evaluation and treatment.    Final Clinical Impressions(s) / ED Diagnoses   Final  diagnoses:  Nonintractable headache, unspecified chronicity pattern, unspecified headache type    ED Discharge Orders        Ordered    amoxicillin (AMOXIL) 500 MG capsule  3 times daily,   Status:  Discontinued     09/17/17 0759    Ambulatory referral to Neurology    Comments:  An appointment is requested in approximately: 1-2 weeks   09/17/17 0811       Rise MuLeaphart, Kenneth T, PA-C 09/17/17 1118    Rise MuLeaphart, Kenneth T, PA-C 09/17/17 1119    Donnetta Hutchingook, Brian, MD 09/23/17 1029

## 2017-09-17 NOTE — ED Notes (Signed)
Pt reports hx "migraines", HA with photosensitivity, for years, reports for last few days daily HA. Pt reports not eating well and recently changing to 3rd shift, reports 6-7 hours/ sleep day, states, "I'm still tired."

## 2017-09-26 ENCOUNTER — Emergency Department (HOSPITAL_COMMUNITY)
Admission: EM | Admit: 2017-09-26 | Discharge: 2017-09-26 | Disposition: A | Discharge status: Home / Self Care | Payer: BLUE CROSS/BLUE SHIELD | Attending: Emergency Medicine | Admitting: Emergency Medicine

## 2017-09-26 ENCOUNTER — Encounter (HOSPITAL_COMMUNITY): Payer: Self-pay | Admitting: Emergency Medicine

## 2017-09-26 ENCOUNTER — Other Ambulatory Visit: Payer: Self-pay

## 2017-09-26 DIAGNOSIS — Z202 Contact with and (suspected) exposure to infections with a predominantly sexual mode of transmission: Secondary | ICD-10-CM | POA: Diagnosis not present

## 2017-09-26 DIAGNOSIS — Z711 Person with feared health complaint in whom no diagnosis is made: Secondary | ICD-10-CM

## 2017-09-26 DIAGNOSIS — R369 Urethral discharge, unspecified: Secondary | ICD-10-CM | POA: Diagnosis present

## 2017-09-26 DIAGNOSIS — J45909 Unspecified asthma, uncomplicated: Secondary | ICD-10-CM | POA: Diagnosis not present

## 2017-09-26 DIAGNOSIS — F17210 Nicotine dependence, cigarettes, uncomplicated: Secondary | ICD-10-CM | POA: Insufficient documentation

## 2017-09-26 LAB — URINALYSIS, ROUTINE W REFLEX MICROSCOPIC
Bilirubin Urine: NEGATIVE
GLUCOSE, UA: NEGATIVE mg/dL
Ketones, ur: NEGATIVE mg/dL
NITRITE: NEGATIVE
PH: 6 (ref 5.0–8.0)
PROTEIN: 30 mg/dL — AB
Specific Gravity, Urine: 1.021 (ref 1.005–1.030)
Squamous Epithelial / LPF: NONE SEEN

## 2017-09-26 LAB — RPR: RPR Ser Ql: NONREACTIVE

## 2017-09-26 LAB — HIV ANTIBODY (ROUTINE TESTING W REFLEX): HIV Screen 4th Generation wRfx: NONREACTIVE

## 2017-09-26 MED ORDER — AZITHROMYCIN 250 MG PO TABS
1000.0000 mg | ORAL_TABLET | Freq: Once | ORAL | Status: AC
  Administered 2017-09-26: 1000 mg via ORAL
  Filled 2017-09-26: qty 4

## 2017-09-26 MED ORDER — STERILE WATER FOR INJECTION IJ SOLN
INTRAMUSCULAR | Status: AC
  Filled 2017-09-26: qty 10

## 2017-09-26 MED ORDER — CEFTRIAXONE SODIUM 250 MG IJ SOLR
250.0000 mg | Freq: Once | INTRAMUSCULAR | Status: AC
  Administered 2017-09-26: 250 mg via INTRAMUSCULAR
  Filled 2017-09-26: qty 250

## 2017-09-26 NOTE — Discharge Instructions (Signed)
Follow up with [facility] STD clinic to be screened for HIV in the future and for future STD concerns or screenings. This is the recommendation by the CDC for people with multiple sexual partners or hx of STDs. You have been treated for gonorrhea and chlamydia in the ER but the hospital will call you if lab is positive.  ° °

## 2017-09-26 NOTE — ED Triage Notes (Signed)
Pt concerned for exposure to STD. Reports penile discharge and burning with urination.

## 2017-09-26 NOTE — ED Provider Notes (Signed)
[facility]   CSN: [id number] Arrival date & time: 09/26/17  0541     History   Chief Complaint Chief Complaint  Patient presents with  . SEXUALLY TRANSMITTED DISEASE    HPI Tyrone Payne is a 26 y.o. male   Heart with history of asthma, chlamydia, herpes, and migraines who presents today with chief complaint acute onset dysuria and penile discharge for 2 days.  States discharge is mucousy and yellow-green.  Endorses burning pain with urination which she states radiates from the tip of his penis to his scrotum.  Has not tried anything for his symptoms.  Currently sexually active with 3 partners in the past 6 months or so.  States he does use condoms but has been treated for STDs in the past.  States 1 of his partners has similar symptoms.  Denies hematuria, penile pain, erythema, or swelling, scrotal pain or swelling, genital sores, rectal pain, melena, hematochezia, fevers, chills, abdominal pain, nausea, vomiting, chest pain, or shortness of breath.  The history is provided by the patient.    Past Medical History:  Diagnosis Date  . Asthma   . Chlamydia   . Herpes   . Migraines     There are no active problems to display for this patient.   History reviewed. No pertinent surgical history.     Home Medications    Prior to Admission medications   Medication Sig Start Date End Date Taking? Authorizing Provider  vitamin C (ASCORBIC ACID) 500 MG tablet Take 500-1,000 mg daily as needed by mouth (when feeling sick).    [provider]    Family History History reviewed. No pertinent family history.  Social History Social History   Tobacco Use  . Smoking status: Current Every Day Smoker    Packs/day: 0.50    Types: Cigarettes  . Smokeless tobacco: Never Used  Substance Use Topics  . Alcohol use: Yes    Comment: occ  . Drug use: No     Allergies   Penicillins   Review of Systems Review of  Systems  Constitutional: Negative for chills and fever.  Respiratory: Negative for shortness of breath.   Cardiovascular: Negative for chest pain.  Gastrointestinal: Negative for abdominal pain, anal bleeding, blood in stool, nausea and vomiting.  Genitourinary: Positive for discharge and dysuria. Negative for hematuria, penile pain, penile swelling, scrotal swelling, testicular pain and urgency.  All other systems reviewed and are negative.    Physical Exam Updated Vital Signs BP 108/81 (BP Location: Right Arm)   Pulse 81   Resp 18   Ht 5\' 7"  (1.702 m)   Wt 56.7 kg (125 lb)   SpO2 97%   BMI 19.58 kg/m   Physical Exam  Constitutional: He appears well-developed and well-nourished. No distress.  HENT:  Head: Normocephalic and atraumatic.  Eyes: Conjunctivae are normal. Right eye exhibits no discharge. Left eye exhibits no discharge.  Neck: Normal range of motion. No JVD present. No tracheal deviation present.  Cardiovascular: Normal rate, regular rhythm and normal heart sounds.  Pulmonary/Chest: Effort normal and breath sounds normal.  Abdominal: Soft. Bowel sounds are normal. He exhibits no distension. There is no tenderness.  Genitourinary:  Genitourinary Comments: Examination performed in the presence of a chaperone.  No inguinal lymphadenopathy.  No lesions to the genitalia.  Yellow-green discharge from the urethral meatus.  Pain is nontender to palpation, no erythema or swelling.  No scrotal swelling.  No  tenderness to palpation  of the testes or epididymis.  Musculoskeletal: Normal range of motion. He exhibits no edema.  Neurological: He is alert.  Skin: Skin is warm and dry. No erythema.  Psychiatric: He has a normal mood and affect. His behavior is normal.  Nursing note and vitals reviewed.    ED Treatments / Results  Labs (all labs ordered are listed, but only abnormal results are displayed) Labs Reviewed  URINALYSIS, ROUTINE W REFLEX MICROSCOPIC - Abnormal; Notable  for the following components:      Result Value   APPearance CLOUDY (*)    Hgb urine dipstick MODERATE (*)    Protein, ur 30 (*)    Leukocytes, UA LARGE (*)    Bacteria, UA RARE (*)    All other components within normal limits  RPR  HIV ANTIBODY (ROUTINE TESTING)  GC/CHLAMYDIA PROBE AMP ([place]) NOT [facility]    EKG  EKG Interpretation None       Radiology No results found.  Procedures Procedures (including critical care time)  Medications Ordered in ED Medications  cefTRIAXone (ROCEPHIN) injection 250 mg (not administered)  azithromycin (ZITHROMAX) tablet 1,000 mg (not administered)     Initial Impression / Assessment and Plan / ED Course  I have reviewed the triage vital signs and the nursing notes.  Pertinent labs & imaging results that were available during my care of the patient were reviewed by me and considered in my medical decision making (see chart for details).     Patient is afebrile without abdominal tenderness, abdominal pain or painful bowel movements to indicate prostatitis.  No tenderness to palpation of the testes or epididymis to suggest orchitis or epididymitis.  STD cultures obtained including HIV, syphilis, gonorrhea and chlamydia.  UA with too numerous to count WBCs and RBCs.  Likely urethritis.  Will treat with erythromycin and Rocephin which she has tolerated multiple times in the past.  He has tested positive for gonorrhea and chlamydia int he past. Patient to be discharged with instructions to follow up with PCP. Discussed importance of using protection when sexually active. Pt understands that they have GC/Chlamydia cultures pending and that they will need to inform all sexual partners if results return positive. Pt verbalized understanding of and agreement with plan and is safe for discharge home at this time.   Final Clinical Impressions(s) / ED Diagnoses   Final diagnoses:  Concern about STD in male without diagnosis    ED Discharge  Orders    None       Bennye AlmFawze, Aziel Morgan A, PA-C 09/26/17 0802    Gerhard MunchLockwood, Robert, MD 09/26/17 [phone]

## 2017-09-29 LAB — GC/CHLAMYDIA PROBE AMP (~~LOC~~) NOT AT ARMC
Chlamydia: NEGATIVE
Neisseria Gonorrhea: POSITIVE — AB

## 2017-10-05 IMAGING — CR DG CHEST 2V
2 series · 2 of 2 positions shown · non-contrast
Comparison: Two-view chest x-ray 09/19/2014

CLINICAL DATA: Pre-employment physical exam.

EXAM:
CHEST  2 VIEW

[view not recorded (1 of 2)]
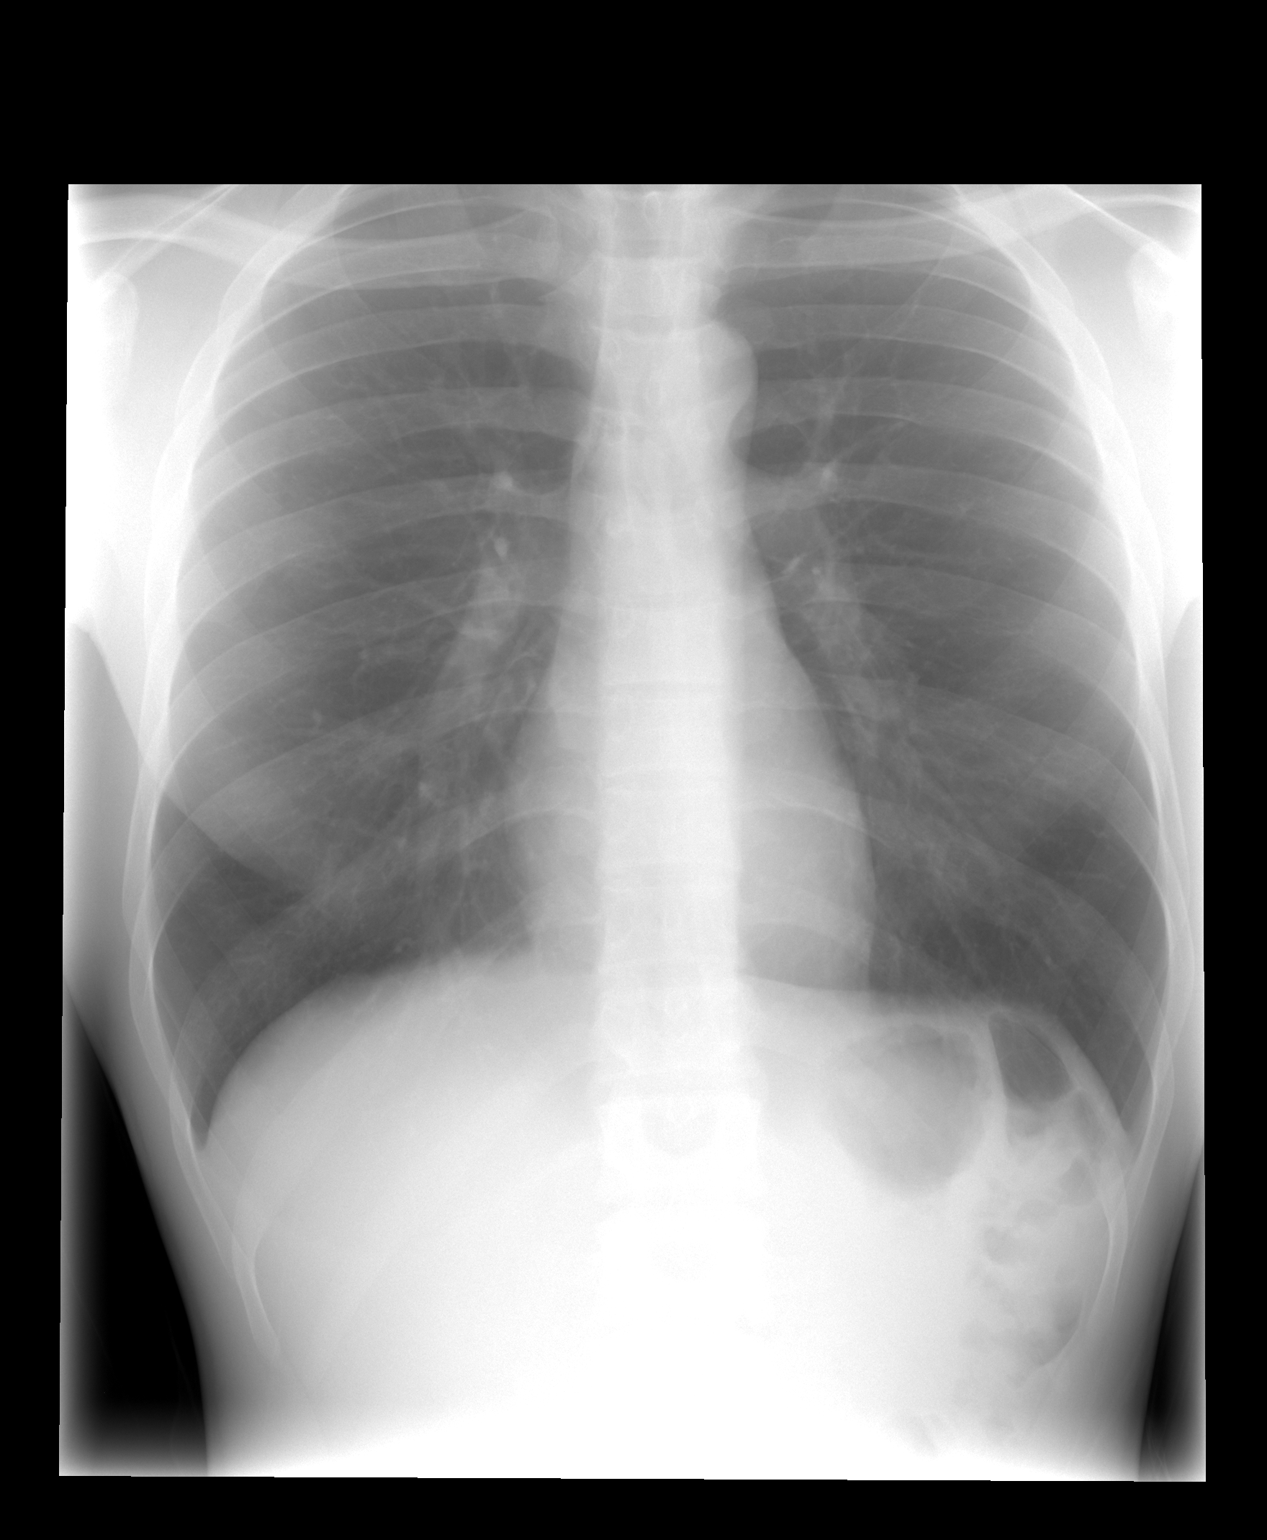

[view not recorded (2 of 2)]
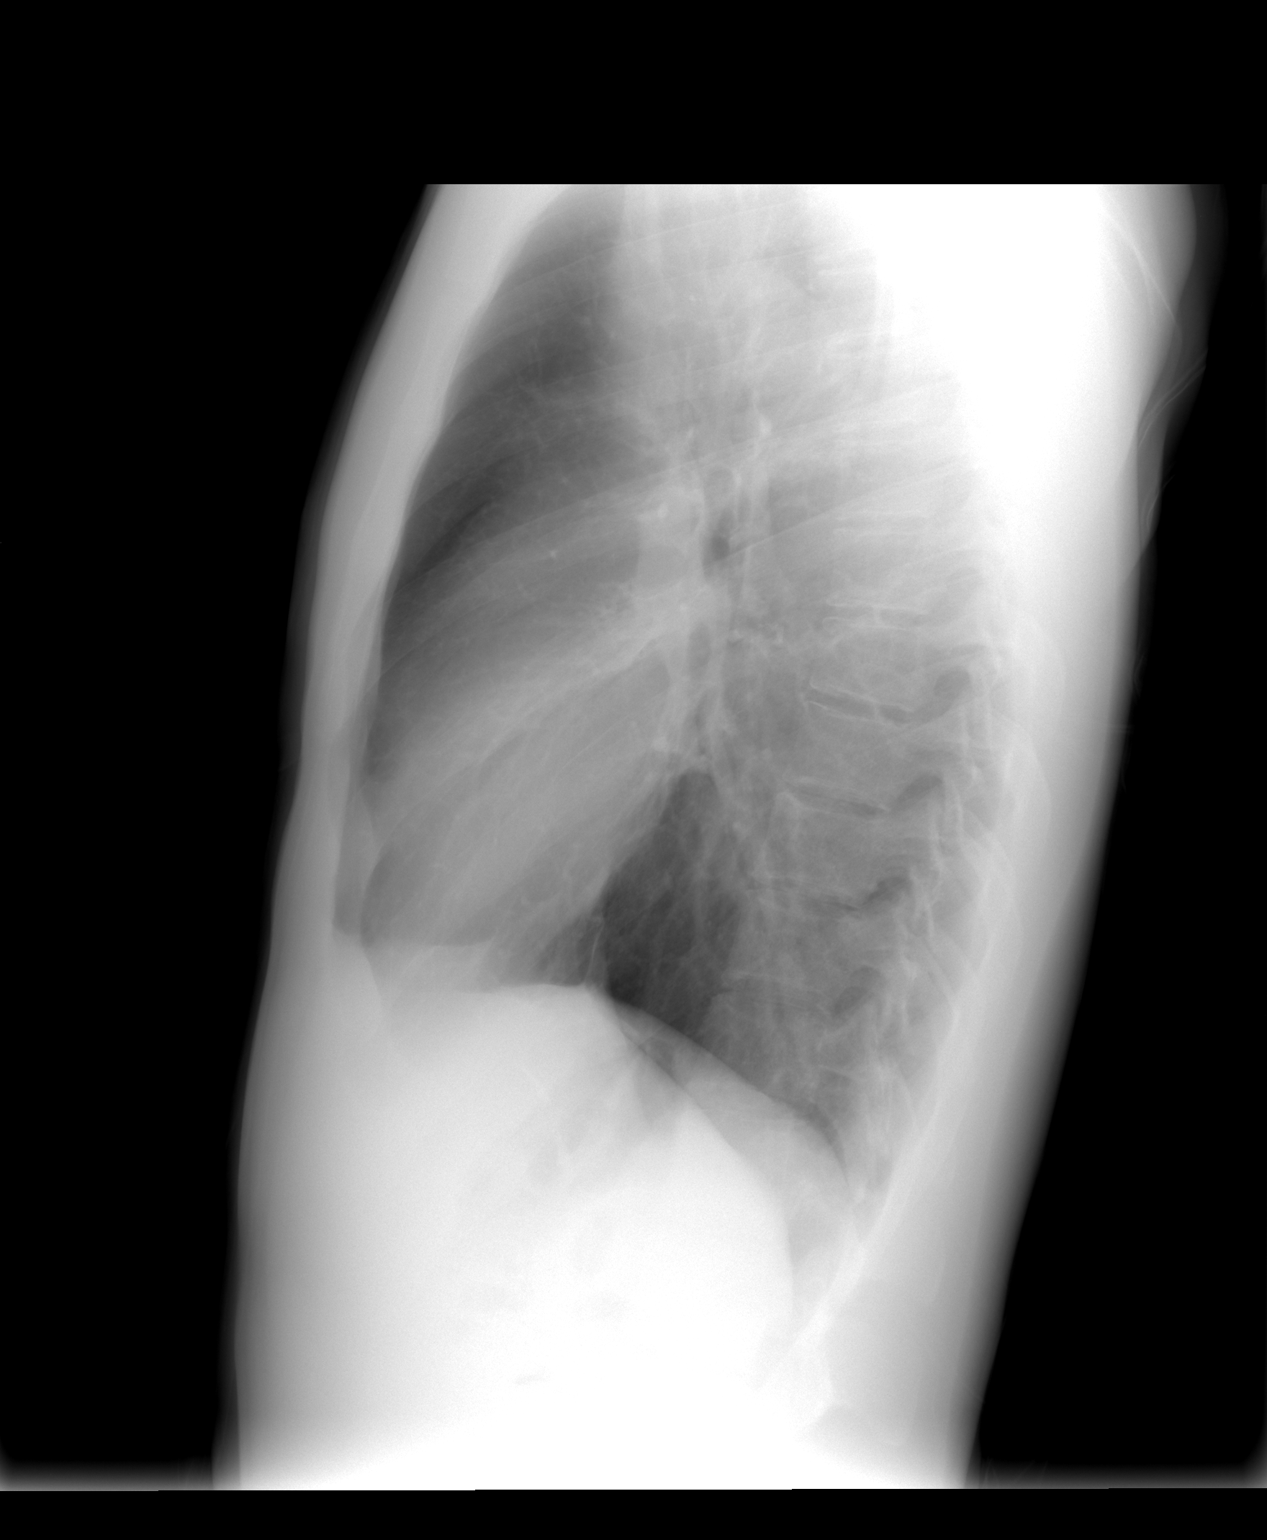

[2 of 2 positions shown; findings below may reference images not displayed]

FINDINGS: Heart size normal. Lungs are clear. The visualized soft tissues and
bony thorax are unremarkable.
IMPRESSION: Negative two view chest x-ray

## 2018-09-07 ENCOUNTER — Encounter (HOSPITAL_COMMUNITY): Payer: Self-pay | Admitting: Emergency Medicine

## 2018-09-07 ENCOUNTER — Ambulatory Visit (HOSPITAL_COMMUNITY)
Admission: EM | Admit: 2018-09-07 | Discharge: 2018-09-07 | Disposition: A | Discharge status: Home / Self Care | Payer: BLUE CROSS/BLUE SHIELD | Attending: Family Medicine | Admitting: Family Medicine

## 2018-09-07 DIAGNOSIS — H1031 Unspecified acute conjunctivitis, right eye: Secondary | ICD-10-CM

## 2018-09-07 MED ORDER — TETRACAINE HCL 0.5 % OP SOLN
OPHTHALMIC | Status: AC
  Filled 2018-09-07: qty 4

## 2018-09-07 MED ORDER — OFLOXACIN 0.3 % OP SOLN: 1.0000 [drp] | Freq: Four times a day (QID) | OPHTHALMIC | 0 refills | Status: AC

## 2018-09-07 MED ORDER — OFLOXACIN 0.3 % OP SOLN: 1.0000 [drp] | Freq: Four times a day (QID) | OPHTHALMIC | 0 refills | Status: DC

## 2018-09-07 MED ORDER — POLYETHYL GLYCOL-PROPYL GLYCOL 0.4-0.3 % OP GEL: 1.0000 "application " | Freq: Every evening | OPHTHALMIC | 0 refills | Status: AC | PRN

## 2018-09-07 NOTE — Discharge Instructions (Signed)
Use ofloxacin eyedrops as directed on right eye. Artificial tear gel at night and as needed. Wait 10-15 minutes between drops, always use artificial tear gel last, as it prevents drops from penetrating through. As discussed, you can put both in the fridge to help sooth the eye. Monitor for any worsening of symptoms, changes in vision, sensitivity to light, eye swelling, painful eye movement, follow up with ophthalmology for further evaluation.

## 2018-09-07 NOTE — ED Triage Notes (Signed)
Pt states a couple days ago he was washing his dog and he felt something flick into his right eye, states he feels like its still in there and his eye is red and itchy

## 2018-09-07 NOTE — ED Provider Notes (Signed)
[facility]    CSN: [id number] Arrival date & time: 09/07/18  1026     History   Chief Complaint Chief Complaint  Patient presents with  . Foreign Body in Eye    HPI Tyrone Payne is a 27 y.o. male.   27 year old male comes in for few day history of right eye irritation and foreign body sensation.  States he was washing his style, and felt something fly into his right eye.  He states work has a Financial pl[employer], and has been using that to rinse the eye without much improvement.  Has eye redness, and itching/irritation.  Denies vision changes, photophobia.  Does have crusting of the eye in the morning.  Does not wear contacts, glasses.     Past Medical History:  Diagnosis Date  . Asthma   . Chlamydia   . Herpes   . Migraines     There are no active problems to display for this patient.   History reviewed. No pertinent surgical history.     Home Medications    Prior to Admission medications   Medication Sig Start Date End Date Taking? Authorizing Provider  ofloxacin (OCUFLOX) 0.3 % ophthalmic solution Place 1 drop into the right eye 4 (four) times daily for 7 days. 09/07/18 09/14/18  Belinda Fisher, PA-C  Polyethyl Glycol-Propyl Glycol (SYSTANE) 0.4-0.3 % GEL ophthalmic gel Place 1 application into both eyes at bedtime as needed. 09/07/18   Cathie Hoops,  V, PA-C  vitamin C (ASCORBIC ACID) 500 MG tablet Take 500-1,000 mg daily as needed by mouth (when feeling sick).    [provider]    Family History No family history on file.  Social History Social History   Tobacco Use  . Smoking status: Current Every Day Smoker    Packs/day: 0.50    Types: Cigarettes  . Smokeless tobacco: Never Used  Substance Use Topics  . Alcohol use: Yes    Comment: occ  . Drug use: No     Allergies   Penicillins   Review of Systems Review of Systems  Reason unable to perform ROS: See HPI as above.     Physical Exam Triage Vital Signs ED Triage Vitals  Enc  Vitals Group     BP 09/07/18 1129 122/74     Pulse Rate 09/07/18 1129 77     Resp 09/07/18 1129 16     Temp 09/07/18 1129 98.1 F (36.7 C)     Temp src --      SpO2 09/07/18 1129 100 %     Weight --      Height --      Head Circumference --      Peak Flow --      Pain Score 09/07/18 1130 4     Pain Loc --      Pain Edu? --      Excl. in GC? --    No data found.  Updated Vital Signs BP 122/74   Pulse 77   Temp 98.1 F (36.7 C)   Resp 16   SpO2 100%   Visual Acuity Right Eye Distance: 20/25 Left Eye Distance: 20/20 Bilateral Distance: 20/13  Right Eye Near:   Left Eye Near:    Bilateral Near:     Physical Exam  Constitutional: He is oriented to person, place, and time. He appears well-developed and well-nourished. No distress.  HENT:  Head: Normocephalic and atraumatic.  Eyes: Pupils are equal, round, and reactive to light. EOM  and lids are normal. Lids are everted and swept, no foreign bodies found. Right conjunctiva is injected. Left conjunctiva is not injected.  No ciliary injection.  No obvious foreign body.  PH of 7  Fluorescein stain without uptake.  Neck: Normal range of motion. Neck supple.  Neurological: He is alert and oriented to person, place, and time.  Skin: Skin is warm and dry.     UC Treatments / Results  Labs (all labs ordered are listed, but only abnormal results are displayed) Labs Reviewed - No data to display  EKG None  Radiology No results found.  Procedures Procedures (including critical care time)  Medications Ordered in UC Medications - No data to display  Initial Impression / Assessment and Plan / UC Course  I have reviewed the triage vital signs and the nursing notes.  Pertinent labs & imaging results that were available during my care of the patient were reviewed by me and considered in my medical decision making (see chart for details).    No obvious foreign body seen. Start ofloxacin drops as directed. Artificial  tears gel as directed. Patient to follow up with ophthalmology if symptoms worsens or does not improve. Return precautions given.   Final Clinical Impressions(s) / UC Diagnoses   Final diagnoses:  Acute conjunctivitis of right eye, unspecified acute conjunctivitis type    ED Prescriptions    Medication Sig Dispense Auth. Provider   ofloxacin (OCUFLOX) 0.3 % ophthalmic solution  (Status: Discontinued) Place 1 drop into the right eye 4 (four) times daily for 5 days. 1 mL ,  V, PA-C   Polyethyl Glycol-Propyl Glycol (SYSTANE) 0.4-0.3 % GEL ophthalmic gel Place 1 application into both eyes at bedtime as needed. 1 Bottle ,  V, PA-C   ofloxacin (OCUFLOX) 0.3 % ophthalmic solution Place 1 drop into the right eye 4 (four) times daily for 7 days. 1.4 mL Threasa Alpha, [place] 09/07/18 1207

## 2020-02-07 ENCOUNTER — Emergency Department (HOSPITAL_COMMUNITY)
Admission: EM | Admit: 2020-02-07 | Discharge: 2020-02-07 | Disposition: A | Discharge status: Home / Self Care | Payer: BLUE CROSS/BLUE SHIELD | Attending: Emergency Medicine | Admitting: Emergency Medicine

## 2020-02-07 ENCOUNTER — Encounter (HOSPITAL_COMMUNITY): Payer: Self-pay | Admitting: *Deleted

## 2020-02-07 ENCOUNTER — Other Ambulatory Visit: Payer: Self-pay

## 2020-02-07 DIAGNOSIS — R369 Urethral discharge, unspecified: Secondary | ICD-10-CM | POA: Insufficient documentation

## 2020-02-07 DIAGNOSIS — J45909 Unspecified asthma, uncomplicated: Secondary | ICD-10-CM | POA: Insufficient documentation

## 2020-02-07 DIAGNOSIS — R300 Dysuria: Secondary | ICD-10-CM | POA: Insufficient documentation

## 2020-02-07 DIAGNOSIS — Z202 Contact with and (suspected) exposure to infections with a predominantly sexual mode of transmission: Secondary | ICD-10-CM

## 2020-02-07 DIAGNOSIS — F17210 Nicotine dependence, cigarettes, uncomplicated: Secondary | ICD-10-CM | POA: Insufficient documentation

## 2020-02-07 LAB — URINALYSIS, ROUTINE W REFLEX MICROSCOPIC
Bilirubin Urine: NEGATIVE
Glucose, UA: NEGATIVE mg/dL
Hgb urine dipstick: NEGATIVE
Ketones, ur: NEGATIVE mg/dL
Nitrite: NEGATIVE
Protein, ur: NEGATIVE mg/dL
Specific Gravity, Urine: 1.01 (ref 1.005–1.030)
pH: 6 (ref 5.0–8.0)

## 2020-02-07 LAB — RPR: RPR Ser Ql: NONREACTIVE

## 2020-02-07 LAB — HIV ANTIBODY (ROUTINE TESTING W REFLEX): HIV Screen 4th Generation wRfx: NONREACTIVE

## 2020-02-07 MED ORDER — CEFTRIAXONE SODIUM 500 MG IJ SOLR
500.0000 mg | Freq: Once | INTRAMUSCULAR | Status: AC
  Administered 2020-02-07: 08:00:00 500 mg via INTRAMUSCULAR
  Filled 2020-02-07: qty 500

## 2020-02-07 MED ORDER — LIDOCAINE HCL (PF) 1 % IJ SOLN
INTRAMUSCULAR | Status: AC
  Administered 2020-02-07: 08:00:00 5 mL
  Filled 2020-02-07: qty 5

## 2020-02-07 MED ORDER — DOXYCYCLINE HYCLATE 100 MG PO CAPS: 100.0000 mg | ORAL_CAPSULE | Freq: Two times a day (BID) | ORAL | 0 refills | Status: DC

## 2020-02-07 NOTE — ED Notes (Signed)
Patient Alert and oriented to baseline. Stable and ambulatory to baseline. Patient verbalized understanding of the discharge instructions.  Patient belongings were taken by the patient.   

## 2020-02-07 NOTE — Discharge Instructions (Signed)
Take Doxycyline twice a day for one week Do not have sex for 2 weeks Have all partners tested and treated If your test is abnormal, you will be called but you have been treated for Gonorrhea and Chlamydia today. You can also review your results on MyChart Practice safe sex and use a condom to prevent infection or unwanted pregnancy Follow up with the Health Department

## 2020-02-07 NOTE — ED Triage Notes (Signed)
The pt is here to be checked for std he would not give me any more information   Symptoms unknown

## 2020-02-07 NOTE — ED Provider Notes (Signed)
[facility] EMERGENCY DEPARTMENT Provider Note   CSN: [id number] Arrival date & time: 02/07/20  6962     History Chief Complaint  Patient presents with  . Exposure to STD    Tyrone Payne is a 29 y.o. male who presents with exposure to STD. He states "I need a shot". He states symptoms started 2 days ago. He reports dysuria and penile discharge. He states he has similar symptoms in the past and was diagnosed with a STD. He was sexually active with a new partner and did not use protection. He denies fever, vomiting, testicular pain.  HPI     Past Medical History:  Diagnosis Date  . Asthma   . Chlamydia   . Herpes   . Migraines     There are no problems to display for this patient.   History reviewed. No pertinent surgical history.     No family history on file.  Social History   Tobacco Use  . Smoking status: Current Every Day Smoker    Packs/day: 0.50    Types: Cigarettes  . Smokeless tobacco: Never Used  Substance Use Topics  . Alcohol use: Yes    Comment: occ  . Drug use: No    Home Medications Prior to Admission medications   Medication Sig Start Date End Date Taking? Authorizing Provider  Polyethyl Glycol-Propyl Glycol (SYSTANE) 0.4-0.3 % GEL ophthalmic gel Place 1 application into both eyes at bedtime as needed. 09/07/18   Tasia Catchings, Amy V, PA-C  vitamin C (ASCORBIC ACID) 500 MG tablet Take 500-1,000 mg daily as needed by mouth (when feeling sick).    [provider]    Allergies    Penicillins  Review of Systems   Review of Systems  Constitutional: Negative for fever.  Genitourinary: Positive for discharge and dysuria. Negative for flank pain and testicular pain.    Physical Exam Updated Vital Signs BP 124/63 (BP Location: Right Arm)   Pulse 79   Temp 97.7 F (36.5 C) (Oral)   Resp 16   Ht 5\' 7"  (1.702 m)   Wt 56.7 kg   SpO2 99%   BMI 19.58 kg/m   Physical Exam Vitals and nursing note reviewed.  Constitutional:       General: He is not in acute distress.    Appearance: Normal appearance. He is well-developed. He is not ill-appearing.  HENT:     Head: Normocephalic and atraumatic.  Eyes:     General: No scleral icterus.       Right eye: No discharge.        Left eye: No discharge.     Conjunctiva/sclera: Conjunctivae normal.     Pupils: Pupils are equal, round, and reactive to light.  Cardiovascular:     Rate and Rhythm: Normal rate.  Pulmonary:     Effort: Pulmonary effort is normal. No respiratory distress.  Abdominal:     General: There is no distension.  Genitourinary:    Comments: GU exam deferred - pt is in hallway Musculoskeletal:     Cervical back: Normal range of motion.  Skin:    General: Skin is warm and dry.  Neurological:     Mental Status: He is alert and oriented to person, place, and time.  Psychiatric:        Behavior: Behavior normal.     ED Results / Procedures / Treatments   Labs (all labs ordered are listed, but only abnormal results are displayed) Labs Reviewed  RPR  URINALYSIS,  ROUTINE W REFLEX MICROSCOPIC  HIV ANTIBODY (ROUTINE TESTING W REFLEX)  GC/CHLAMYDIA PROBE AMP ([place]) NOT AT [facility]    EKG None  Radiology No results found.  Procedures Procedures (including critical care time)  Medications Ordered in ED Medications - No data to display  ED Course  I have reviewed the triage vital signs and the nursing notes.  Pertinent labs & imaging results that were available during my care of the patient were reviewed by me and considered in my medical decision making (see chart for details).  29 year old male presents with dysuria and penile discharge.  He states symptoms are similar as to when he has had an STD in the past.  GU exam was deferred since patient was in the hallway.  His vital signs are normal.  Full STD panel was obtained.  We will treat empirically for gonorrhea and chlamydia.  He was given a shot of Rocephin here and prescription  for doxycycline.  He is instructed to take the full course of medicine even if symptoms are improving.  Safe sex practices were discussed.  He is advised to not have sexual intercourse for at least 14 days and to have all partners tested and treated.  MDM Rules/Calculators/A&P                       Final Clinical Impression(s) / ED Diagnoses Final diagnoses:  Possible exposure to STD    Rx / DC Orders ED Discharge Orders    None       Bethel Born, PA-C 02/07/20 2703    Eber Hong, MD 02/08/20 [phone]

## 2020-02-08 LAB — GC/CHLAMYDIA PROBE AMP (~~LOC~~) NOT AT ARMC
Chlamydia: NEGATIVE
Comment: NEGATIVE
Comment: NORMAL
Neisseria Gonorrhea: NEGATIVE

## 2020-12-30 ENCOUNTER — Emergency Department (HOSPITAL_COMMUNITY)
Admission: EM | Admit: 2020-12-30 | Discharge: 2020-12-30 | Disposition: A | Discharge status: Home / Self Care | Payer: Self-pay | Attending: Emergency Medicine | Admitting: Emergency Medicine

## 2020-12-30 ENCOUNTER — Other Ambulatory Visit: Payer: Self-pay

## 2020-12-30 ENCOUNTER — Encounter (HOSPITAL_COMMUNITY): Payer: Self-pay | Admitting: Emergency Medicine

## 2020-12-30 DIAGNOSIS — R197 Diarrhea, unspecified: Secondary | ICD-10-CM | POA: Insufficient documentation

## 2020-12-30 DIAGNOSIS — R1084 Generalized abdominal pain: Secondary | ICD-10-CM | POA: Insufficient documentation

## 2020-12-30 DIAGNOSIS — J45909 Unspecified asthma, uncomplicated: Secondary | ICD-10-CM | POA: Insufficient documentation

## 2020-12-30 DIAGNOSIS — F17210 Nicotine dependence, cigarettes, uncomplicated: Secondary | ICD-10-CM | POA: Insufficient documentation

## 2020-12-30 DIAGNOSIS — R112 Nausea with vomiting, unspecified: Secondary | ICD-10-CM | POA: Insufficient documentation

## 2020-12-30 LAB — COMPREHENSIVE METABOLIC PANEL
ALT: 27 U/L (ref 0–44)
AST: 39 U/L (ref 15–41)
Albumin: 5.1 g/dL — ABNORMAL HIGH (ref 3.5–5.0)
Alkaline Phosphatase: 55 U/L (ref 38–126)
Anion gap: 16 — ABNORMAL HIGH (ref 5–15)
BUN: 15 mg/dL (ref 6–20)
CO2: 16 mmol/L — ABNORMAL LOW (ref 22–32)
Calcium: 10.6 mg/dL — ABNORMAL HIGH (ref 8.9–10.3)
Chloride: 102 mmol/L (ref 98–111)
Creatinine, Ser: 1.24 mg/dL (ref 0.61–1.24)
GFR, Estimated: >60 mL/min (ref >60)
Glucose, Bld: 104 mg/dL — ABNORMAL HIGH (ref 70–99)
Potassium: 4.1 mmol/L (ref 3.5–5.1)
Sodium: 134 mmol/L — ABNORMAL LOW (ref 135–145)
Total Bilirubin: 2.3 mg/dL — ABNORMAL HIGH (ref 0.3–1.2)
Total Protein: 8.6 g/dL — ABNORMAL HIGH (ref 6.5–8.1)

## 2020-12-30 LAB — CBC
HCT: 49.9 % (ref 39.0–52.0)
Hemoglobin: 18.2 g/dL — ABNORMAL HIGH (ref 13.0–17.0)
MCH: 30.3 pg (ref 26.0–34.0)
MCHC: 36.5 g/dL — ABNORMAL HIGH (ref 30.0–36.0)
MCV: 83 fL (ref 80.0–100.0)
Platelets: 254 10*3/uL (ref 150–400)
RBC: 6.01 MIL/uL — ABNORMAL HIGH (ref 4.22–5.81)
RDW: 13.2 % (ref 11.5–15.5)
WBC: 7.3 10*3/uL (ref 4.0–10.5)
nRBC: 0 % (ref 0.0–0.2)

## 2020-12-30 LAB — LIPASE, BLOOD: Lipase: 26 U/L (ref 11–51)

## 2020-12-30 LAB — CBG MONITORING, ED: Glucose-Capillary: 126 mg/dL — ABNORMAL HIGH (ref 70–99)

## 2020-12-30 MED ORDER — ONDANSETRON 4 MG PO TBDP: 4.0000 mg | ORAL_TABLET | Freq: Three times a day (TID) | ORAL | 0 refills | Status: AC | PRN

## 2020-12-30 MED ORDER — LOPERAMIDE HCL 2 MG PO CAPS: 2.0000 mg | ORAL_CAPSULE | Freq: Four times a day (QID) | ORAL | 0 refills | Status: AC | PRN

## 2020-12-30 MED ORDER — ONDANSETRON HCL 4 MG/2ML IJ SOLN
4.0000 mg | Freq: Once | INTRAMUSCULAR | Status: AC
  Administered 2020-12-30: 4 mg via INTRAVENOUS
  Filled 2020-12-30: qty 2

## 2020-12-30 MED ORDER — SODIUM CHLORIDE 0.9 % IV BOLUS
1000.0000 mL | Freq: Once | INTRAVENOUS | Status: AC
  Administered 2020-12-30: 1000 mL via INTRAVENOUS

## 2020-12-30 NOTE — ED Notes (Signed)
PT states nausea improved. 

## 2020-12-30 NOTE — ED Provider Notes (Signed)
[facility] EMERGENCY DEPARTMENT Provider Note   CSN: [id number] Arrival date & time: 12/30/20  3893     History Chief Complaint  Patient presents with  . Abdominal Pain  . Vomiting    Tyrone Payne is a 30 y.o. male has no history of asthma, chlamydia, herpes who presents for evaluation of generalized abdominal pain, nausea/vomiting/diarrhea that has been ongoing since last night.  He reports diffuse abdominal cramping.  He states that he has not been able to tolerate any p.o. since last night.  He reports multiple episodes of nonbloody, nonbilious emesis.  He also reports he has had multiple episodes of diarrhea.  No blood in stools.  No recent antibiotic use, travel outside the country.  He states he did not eat any abnormal food that he knows of.  Does report that his son had symptoms a few days ago but is better.  He denies any fevers, chills, chest pain, urinary complaints, difficulty breathing.  The history is provided by the patient.       Past Medical History:  Diagnosis Date  . Asthma   . Chlamydia   . Herpes   . Migraines     There are no problems to display for this patient.   History reviewed. No pertinent surgical history.     No family history on file.  Social History   Tobacco Use  . Smoking status: Current Every Day Smoker    Packs/day: 0.50    Types: Cigarettes  . Smokeless tobacco: Never Used  Substance Use Topics  . Alcohol use: Yes    Comment: occ  . Drug use: No    Home Medications Prior to Admission medications   Medication Sig Start Date End Date Taking? Authorizing Provider  loperamide (IMODIUM) 2 MG capsule Take 1 capsule (2 mg total) by mouth 4 (four) times daily as needed for diarrhea or loose stools. 12/30/20  Yes Graciella Freer A, PA-C  ondansetron (ZOFRAN ODT) 4 MG disintegrating tablet Take 1 tablet (4 mg total) by mouth every 8 (eight) hours as needed for nausea or vomiting. 12/30/20  Yes Graciella Freer A,  PA-C  doxycycline (VIBRAMYCIN) 100 MG capsule Take 1 capsule (100 mg total) by mouth 2 (two) times daily. 02/07/20   Bethel Born, PA-C  Polyethyl Glycol-Propyl Glycol (SYSTANE) 0.4-0.3 % GEL ophthalmic gel Place 1 application into both eyes at bedtime as needed. 09/07/18   Cathie Hoops, Amy V, PA-C  vitamin C (ASCORBIC ACID) 500 MG tablet Take 500-1,000 mg daily as needed by mouth (when feeling sick).    [provider]    Allergies    Penicillins  Review of Systems   Review of Systems  Constitutional: Negative for fever.  Respiratory: Negative for cough and shortness of breath.   Cardiovascular: Negative for chest pain.  Gastrointestinal: Positive for abdominal pain, diarrhea, nausea and vomiting. Negative for blood in stool.  Genitourinary: Negative for dysuria and hematuria.  Neurological: Negative for headaches.  All other systems reviewed and are negative.   Physical Exam Updated Vital Signs BP 131/84 (BP Location: Right Arm)   Pulse (!) 104   Temp 98.2 F (36.8 C)   Resp 16   SpO2 100%   Physical Exam Vitals and nursing note reviewed.  Constitutional:      Appearance: Normal appearance. He is well-developed and well-nourished.     Comments: Appears uncomfortable but NAD  HENT:     Head: Normocephalic and atraumatic.     Mouth/Throat:  Mouth: Oropharynx is clear and moist and mucous membranes are normal.  Eyes:     General: Lids are normal.     Extraocular Movements: EOM normal.     Conjunctiva/sclera: Conjunctivae normal.     Pupils: Pupils are equal, round, and reactive to light.  Cardiovascular:     Rate and Rhythm: Normal rate and regular rhythm.     Pulses: Normal pulses.     Heart sounds: Normal heart sounds. No murmur heard. No friction rub. No gallop.   Pulmonary:     Effort: Pulmonary effort is normal.     Breath sounds: Normal breath sounds.     Comments: Lungs clear to auscultation bilaterally.  Symmetric chest rise.  No wheezing, rales,  rhonchi. Abdominal:     Palpations: Abdomen is soft. Abdomen is not rigid.     Tenderness: There is generalized abdominal tenderness. There is no guarding.     Comments: Abdomen is soft, non-distended. Generalized tenderness with no focal point. No rigidity, No guarding. No peritoneal signs.  Musculoskeletal:        General: Normal range of motion.     Cervical back: Full passive range of motion without pain.  Skin:    General: Skin is warm and dry.     Capillary Refill: Capillary refill takes less than 2 seconds.  Neurological:     Mental Status: He is alert and oriented to person, place, and time.  Psychiatric:        Mood and Affect: Mood and affect normal.        Speech: Speech normal.     ED Results / Procedures / Treatments   Labs (all labs ordered are listed, but only abnormal results are displayed) Labs Reviewed  COMPREHENSIVE METABOLIC PANEL - Abnormal; Notable for the following components:      Result Value   Sodium 134 (*)    CO2 16 (*)    Glucose, Bld 104 (*)    Calcium 10.6 (*)    Total Protein 8.6 (*)    Albumin 5.1 (*)    Total Bilirubin 2.3 (*)    Anion gap 16 (*)    All other components within normal limits  CBC - Abnormal; Notable for the following components:   RBC 6.01 (*)    Hemoglobin 18.2 (*)    MCHC 36.5 (*)    All other components within normal limits  CBG MONITORING, ED - Abnormal; Notable for the following components:   Glucose-Capillary 126 (*)    All other components within normal limits  LIPASE, BLOOD  URINALYSIS, ROUTINE W REFLEX MICROSCOPIC    EKG None  Radiology No results found.  Procedures Procedures   Medications Ordered in ED Medications  sodium chloride 0.9 % bolus 1,000 mL (0 mLs Intravenous Stopped 12/30/20 1138)  ondansetron (ZOFRAN) injection 4 mg (4 mg Intravenous Given 12/30/20 1053)    ED Course  I have reviewed the triage vital signs and the nursing notes.  Pertinent labs & imaging results that were available  during my care of the patient were reviewed by me and considered in my medical decision making (see chart for details).    MDM Rules/Calculators/A&P                          30 year old male who presents for evaluation of abdominal pain, nausea/vomiting/diarrhea.  No fevers, urinary complaints.  Reports son had similar symptoms a few days ago.  On initial arrival, he appears  uncomfortable but no acute distress.  Vital signs are stable.  On exam, he has diffuse abdominal tenderness with no focal point.  Consider viral GI process versus gastritis.  History/physical exam concerning for appendicitis, diverticulitis.  Plan for fluids, antiemetics.  CBG is 126. CBC shows no leukocytosis. Hemoglobin is 18.2. CMP shows potassium of 4.1. Normal BUN creatinine. Anion gap 16. Likely from dehydration. Patient given fluids. Lipase is unremarkable.  Patient reports feeling better. He states his abdominal pain has improved and he has not had any vomiting while here in the ED. He has been able tolerate p.o. without any difficulty. Repeat abdominal exam shows improvement in tenderness. Patient states he feels better. At this time, no indication for CT and pelvis is do not suspect surgical abdomen. At this time, patient exhibits no emergent life-threatening condition that require further evaluation in ED. Patient had ample opportunity for questions and discussion. All patient's questions were answered with full understanding. Strict return precautions discussed. Patient expresses understanding and agreement to plan.   Portions of this note were generated with Scientist, clinical (histocompatibility and immunogenetics). Dictation errors may occur despite best attempts at proofreading.   Final Clinical Impression(s) / ED Diagnoses Final diagnoses:  Generalized abdominal pain  Nausea vomiting and diarrhea    Rx / DC Orders ED Discharge Orders         Ordered    ondansetron (ZOFRAN ODT) 4 MG disintegrating tablet  Every 8 hours PRN         12/30/20 1208    loperamide (IMODIUM) 2 MG capsule  4 times daily PRN        12/30/20 1208           Maxwell Caul, PA-C 12/30/20 1210    Pollyann Savoy, MD 12/30/20 1215

## 2020-12-30 NOTE — Discharge Instructions (Signed)
Make sure you are staying hydrated drink plenty of fluids.  For the next few days, stick to the brat diet (bananas, rice, applesauce, toast) which will help with the diarrhea.  Take Zofran for nausea/vomiting. He can take Imodium as needed.  Return to the emergency department for worsening abdominal pain, fevers, vomiting or any other worsening concerning symptoms.

## 2020-12-30 NOTE — ED Triage Notes (Signed)
Pt reports generalized abd pain, nausea, vomiting, and diarrhea.  States his son had the same symptoms a few days ago.

## 2024-06-25 ENCOUNTER — Encounter (HOSPITAL_COMMUNITY): Payer: Self-pay

## 2024-06-25 ENCOUNTER — Other Ambulatory Visit: Payer: Self-pay

## 2024-06-25 ENCOUNTER — Emergency Department (HOSPITAL_COMMUNITY)
Admission: EM | Admit: 2024-06-25 | Discharge: 2024-06-25 | Disposition: A | Discharge status: Home / Self Care | Attending: Emergency Medicine | Admitting: Emergency Medicine

## 2024-06-25 DIAGNOSIS — R300 Dysuria: Secondary | ICD-10-CM | POA: Diagnosis present

## 2024-06-25 DIAGNOSIS — N342 Other urethritis: Secondary | ICD-10-CM | POA: Diagnosis not present

## 2024-06-25 LAB — HIV ANTIBODY (ROUTINE TESTING W REFLEX): HIV Screen 4th Generation wRfx: NONREACTIVE

## 2024-06-25 MED ORDER — CEFTRIAXONE SODIUM 500 MG IJ SOLR
500.0000 mg | Freq: Once | INTRAMUSCULAR | Status: AC
  Administered 2024-06-25: 500 mg via INTRAMUSCULAR
  Filled 2024-06-25: qty 500

## 2024-06-25 MED ORDER — DOXYCYCLINE HYCLATE 100 MG PO CAPS: 100.0000 mg | ORAL_CAPSULE | Freq: Two times a day (BID) | ORAL | 0 refills | Status: AC

## 2024-06-25 MED ORDER — DOXYCYCLINE HYCLATE 100 MG PO TABS
100.0000 mg | ORAL_TABLET | Freq: Once | ORAL | Status: AC
  Administered 2024-06-25: 100 mg via ORAL
  Filled 2024-06-25: qty 1

## 2024-06-25 NOTE — ED Provider Notes (Signed)
 [facility] Emergency Department Provider Note MRN:  [id number]  Arrival date & time: 06/25/24     Chief Complaint   Dysuria   History of Present Illness   Tyrone Payne is a 33 y.o. year-old male presents to the ED with chief complaint of dysuria.  The patient presents with discomfort during urination, described as a burning sensation, without any visible discharge. The symptoms began after a recent sexual encounter where a condom broke. The patient denies fever, chills, nausea, vomiting, diarrhea, abdominal pain, or testicular pain, although there is discomfort described as pushing on the piss line. The patient reports redness in both eyes without itchiness. The patient smokes weed but reports that the eye redness persists regardless. There is no history of sores or bumps on the genital area.   History provided by patient.   Review of Systems  Pertinent positive and negative review of systems noted in HPI.    Physical Exam   Vitals:   06/25/24 0157 06/25/24 0400  BP: 106/73 103/72  Pulse: 62 72  Resp: 18 16  Temp: 99.3 F (37.4 C)   SpO2: 100% 100%    CONSTITUTIONAL:  non toxic-appearing, NAD NEURO:  Alert and oriented x 3, CN 3-12 grossly intact EYES:  eyes equal and reactive ENT/NECK:  Supple, no stridor  CARDIO:  appears well-perfused  PULM:  No respiratory distress,  GI/GU:  non-distended,  MSK/SPINE:  No gross deformities, no edema, moves all extremities  SKIN:  no rash, atraumatic   *Additional and/or pertinent findings included in MDM below  Diagnostic and Interventional Summary    EKG Interpretation Date/Time:    Ventricular Rate:    PR Interval:    QRS Duration:    QT Interval:    QTC Calculation:   R Axis:      Text Interpretation:         Labs Reviewed  HIV ANTIBODY (ROUTINE TESTING W REFLEX)  URINALYSIS, ROUTINE W REFLEX MICROSCOPIC  GC/CHLAMYDIA PROBE AMP ([place]) NOT AT [facility]    No orders to display     Medications  cefTRIAXone  (ROCEPHIN ) injection 500 mg (500 mg Intramuscular Given 06/25/24 0517)  doxycycline  (VIBRA -TABS) tablet 100 mg (100 mg Oral Given 06/25/24 0517)     Procedures  /  Critical Care Procedures  ED Course and Medical Decision Making  I have reviewed the triage vital signs, the nursing notes, and pertinent available records from the EMR.  Social Determinants Affecting Complexity of Care: Patient has no clinically significant social determinants affecting this chief complaint..   ED Course:    Medical Decision Making The patient presented with burning sensation during urination and red eyes. The patient reported a recent incident of condom breakage with a new sexual partner, raising concern for potential STD exposure. There was no fever, chills, nausea, vomiting, diarrhea, abdominal pain, or testicular pain reported. The patient provided a urine sample, and results are pending. The plan includes treating for STDs as a precaution and considering eye drops for red eyes.  Differential Diagnosis: Differential diagnosis includes but is not limited to: urinary tract infection, sexually transmitted infection (such as chlamydia or gonorrhea), conjunctivitis, and non-infectious causes of urethritis.  Patient reassessed.  Unable to provide urine sample.  Will discharge home on doxy for suspected STD.    Amount and/or Complexity of Data Reviewed Labs: ordered.  Risk Prescription drug management.         Consultants: No consultations were needed in caring for this patient.  Treatment and Plan: Emergency department workup does not suggest an emergent condition requiring admission or immediate intervention beyond  what has been performed at this time. The patient is safe for discharge and has  been instructed to return immediately for worsening symptoms, change in  symptoms or any other concerns    Final Clinical Impressions(s) / ED Diagnoses     ICD-10-CM   1.  Urethritis  N34.2       ED Discharge Orders          Ordered    doxycycline  (VIBRAMYCIN ) 100 MG capsule  2 times daily        06/25/24 0622              Discharge Instructions Discussed with and Provided to Patient:     Discharge Instructions      Please take antibiotics as directed.  Please return for new or worsening symptoms.       Vicky Charleston, PA-C 06/25/24 9375    Lorette Mayo, MD 06/25/24 [phone]

## 2024-06-25 NOTE — Discharge Instructions (Signed)
 Please take antibiotics as directed.  Please return for new or worsening symptoms.

## 2024-06-25 NOTE — ED Triage Notes (Signed)
 Pt had sex with new partner and condom broke. Pt had dysuria this morning, decided to sleep it off and woke up eye redness...denies drainage from penis or eyes

## 2024-06-25 NOTE — ED Notes (Signed)
 PT D/C'D AFTER INSTRUCTIONS REVIEWED. PT VERBALIZED UNDERSTANDING. NAD REPORTED OR NOTED AT THIS TIME.

## 2024-06-28 LAB — GC/CHLAMYDIA PROBE AMP (~~LOC~~) NOT AT ARMC
Chlamydia: NEGATIVE
Comment: NEGATIVE
Comment: NORMAL
Neisseria Gonorrhea: NEGATIVE
